# Patient Record
Sex: Female | Born: 1950 | Race: Black or African American | Hispanic: No | Smoking: Former smoker
Health system: Southern US, Community
[De-identification: ages and names within clinical notes are randomized; demographics above are authoritative.]

## PROBLEM LIST (undated history)

## (undated) DIAGNOSIS — E785 Hyperlipidemia, unspecified: Secondary | ICD-10-CM

## (undated) DIAGNOSIS — F419 Anxiety disorder, unspecified: Secondary | ICD-10-CM

## (undated) DIAGNOSIS — D696 Thrombocytopenia, unspecified: Secondary | ICD-10-CM

## (undated) DIAGNOSIS — I10 Essential (primary) hypertension: Secondary | ICD-10-CM

## (undated) DIAGNOSIS — G2581 Restless legs syndrome: Secondary | ICD-10-CM

## (undated) DIAGNOSIS — F329 Major depressive disorder, single episode, unspecified: Secondary | ICD-10-CM

## (undated) DIAGNOSIS — M1990 Unspecified osteoarthritis, unspecified site: Secondary | ICD-10-CM

## (undated) DIAGNOSIS — K219 Gastro-esophageal reflux disease without esophagitis: Secondary | ICD-10-CM

## (undated) DIAGNOSIS — F32A Depression, unspecified: Secondary | ICD-10-CM

## (undated) DIAGNOSIS — J40 Bronchitis, not specified as acute or chronic: Secondary | ICD-10-CM

## (undated) HISTORY — PX: OOPHORECTOMY: SHX86

## (undated) HISTORY — PX: HERNIA REPAIR: SHX51

## (undated) HISTORY — PX: ABDOMINAL HYSTERECTOMY: SHX81

---

## 2001-01-08 HISTORY — PX: BREAST EXCISIONAL BIOPSY: SUR124

## 2001-06-19 HISTORY — PX: BREAST CYST ASPIRATION: SHX578

## 2003-12-05 ENCOUNTER — Emergency Department: Payer: Self-pay | Admitting: General Practice

## 2003-12-05 ENCOUNTER — Other Ambulatory Visit: Payer: Self-pay

## 2003-12-17 ENCOUNTER — Ambulatory Visit: Payer: Self-pay

## 2005-05-25 ENCOUNTER — Ambulatory Visit: Payer: Self-pay

## 2005-07-05 ENCOUNTER — Other Ambulatory Visit: Payer: Self-pay

## 2005-07-05 ENCOUNTER — Emergency Department: Payer: Self-pay | Admitting: Emergency Medicine

## 2006-03-29 ENCOUNTER — Emergency Department: Payer: Self-pay | Admitting: Emergency Medicine

## 2006-03-29 ENCOUNTER — Other Ambulatory Visit: Payer: Self-pay

## 2006-06-11 ENCOUNTER — Emergency Department: Payer: Self-pay | Admitting: Emergency Medicine

## 2006-10-25 ENCOUNTER — Ambulatory Visit: Payer: Self-pay

## 2007-10-23 ENCOUNTER — Ambulatory Visit: Payer: Self-pay

## 2008-04-30 ENCOUNTER — Ambulatory Visit: Payer: Self-pay | Admitting: Gastroenterology

## 2008-06-18 ENCOUNTER — Ambulatory Visit: Payer: Self-pay | Admitting: Family Medicine

## 2009-09-09 ENCOUNTER — Ambulatory Visit: Payer: Self-pay

## 2010-09-15 ENCOUNTER — Ambulatory Visit: Payer: Self-pay

## 2011-11-02 ENCOUNTER — Emergency Department: Payer: Self-pay | Admitting: *Deleted

## 2011-11-02 LAB — COMPREHENSIVE METABOLIC PANEL
Alkaline Phosphatase: 97 U/L (ref 50–136)
Anion Gap: 7 (ref 7–16)
Bilirubin,Total: 0.4 mg/dL (ref 0.2–1.0)
Creatinine: 0.86 mg/dL (ref 0.60–1.30)
EGFR (African American): >60
Glucose: 88 mg/dL (ref 65–99)
SGOT(AST): 23 U/L (ref 15–37)
SGPT (ALT): 16 U/L (ref 12–78)
Sodium: 138 mmol/L (ref 136–145)
Total Protein: 7.9 g/dL (ref 6.4–8.2)

## 2011-11-02 LAB — CBC
HCT: 40.7 % (ref 35.0–47.0)
MCH: 30.1 pg (ref 26.0–34.0)
MCHC: 33.1 g/dL (ref 32.0–36.0)
MCV: 91 fL (ref 80–100)
Platelet: 197 10*3/uL (ref 150–440)
RDW: 15.3 % — ABNORMAL HIGH (ref 11.5–14.5)
WBC: 5.6 10*3/uL (ref 3.6–11.0)

## 2012-05-22 ENCOUNTER — Ambulatory Visit: Payer: Self-pay | Admitting: Family Medicine

## 2012-05-24 ENCOUNTER — Emergency Department: Payer: Self-pay | Admitting: Emergency Medicine

## 2013-03-21 ENCOUNTER — Ambulatory Visit: Payer: Self-pay | Admitting: Family Medicine

## 2013-10-23 ENCOUNTER — Ambulatory Visit: Payer: Self-pay | Admitting: Family Medicine

## 2014-08-15 ENCOUNTER — Other Ambulatory Visit: Payer: Self-pay | Admitting: Family Medicine

## 2014-08-15 DIAGNOSIS — N959 Unspecified menopausal and perimenopausal disorder: Secondary | ICD-10-CM

## 2014-11-17 ENCOUNTER — Other Ambulatory Visit: Payer: Self-pay | Admitting: Family Medicine

## 2014-11-17 DIAGNOSIS — Z1231 Encounter for screening mammogram for malignant neoplasm of breast: Secondary | ICD-10-CM

## 2014-11-25 ENCOUNTER — Ambulatory Visit
Admission: RE | Admit: 2014-11-25 | Discharge: 2014-11-25 | Disposition: A | Discharge status: Home / Self Care | Payer: Medicare Other | Source: Ambulatory Visit | Attending: Family Medicine | Admitting: Family Medicine

## 2014-11-25 ENCOUNTER — Other Ambulatory Visit: Payer: Self-pay | Admitting: Family Medicine

## 2014-11-25 DIAGNOSIS — M8580 Other specified disorders of bone density and structure, unspecified site: Secondary | ICD-10-CM | POA: Insufficient documentation

## 2014-11-25 DIAGNOSIS — Z1231 Encounter for screening mammogram for malignant neoplasm of breast: Secondary | ICD-10-CM

## 2014-11-25 DIAGNOSIS — N959 Unspecified menopausal and perimenopausal disorder: Secondary | ICD-10-CM

## 2014-11-25 DIAGNOSIS — Z13820 Encounter for screening for osteoporosis: Secondary | ICD-10-CM | POA: Insufficient documentation

## 2015-11-25 ENCOUNTER — Other Ambulatory Visit: Payer: Medicare Other

## 2015-11-26 ENCOUNTER — Encounter
Admission: RE | Admit: 2015-11-26 | Discharge: 2015-11-26 | Disposition: A | Discharge status: Home / Self Care | Payer: Medicare Other | Source: Ambulatory Visit | Attending: Surgery | Admitting: Surgery

## 2015-11-26 DIAGNOSIS — Z01812 Encounter for preprocedural laboratory examination: Secondary | ICD-10-CM | POA: Diagnosis not present

## 2015-11-26 DIAGNOSIS — K439 Ventral hernia without obstruction or gangrene: Secondary | ICD-10-CM | POA: Insufficient documentation

## 2015-11-26 HISTORY — DX: Essential (primary) hypertension: I10

## 2015-11-26 HISTORY — DX: Anxiety disorder, unspecified: F41.9

## 2015-11-26 HISTORY — DX: Depression, unspecified: F32.A

## 2015-11-26 HISTORY — DX: Restless legs syndrome: G25.81

## 2015-11-26 HISTORY — DX: Thrombocytopenia, unspecified: D69.6

## 2015-11-26 HISTORY — DX: Unspecified osteoarthritis, unspecified site: M19.90

## 2015-11-26 HISTORY — DX: Hyperlipidemia, unspecified: E78.5

## 2015-11-26 HISTORY — DX: Gastro-esophageal reflux disease without esophagitis: K21.9

## 2015-11-26 HISTORY — DX: Bronchitis, not specified as acute or chronic: J40

## 2015-11-26 HISTORY — DX: Major depressive disorder, single episode, unspecified: F32.9

## 2015-11-26 LAB — CBC
HCT: 42.2 % (ref 35.0–47.0)
Hemoglobin: 14.3 g/dL (ref 12.0–16.0)
MCH: 31.4 pg (ref 26.0–34.0)
MCHC: 34 g/dL (ref 32.0–36.0)
MCV: 92.3 fL (ref 80.0–100.0)
Platelets: 173 K/uL (ref 150–440)
RBC: 4.57 MIL/uL (ref 3.80–5.20)
RDW: 16 % — ABNORMAL HIGH (ref 11.5–14.5)
WBC: 5.6 K/uL (ref 3.6–11.0)

## 2015-11-26 LAB — DIFFERENTIAL
BASOS ABS: 0 10*3/uL (ref 0–0.1)
BASOS PCT: 0 %
EOS ABS: 0.1 10*3/uL (ref 0–0.7)
Eosinophils Relative: 1 %
LYMPHS ABS: 2.8 10*3/uL (ref 1.0–3.6)
Lymphocytes Relative: 49 %
Monocytes Absolute: 0.4 10*3/uL (ref 0.2–0.9)
Monocytes Relative: 8 %
NEUTROS ABS: 2.3 10*3/uL (ref 1.4–6.5)
NEUTROS PCT: 42 %

## 2015-11-26 NOTE — Patient Instructions (Signed)
  Your procedure is scheduled on: December 04, 2015 (Friday) Report to Same Day Surgery 2nd floor Medical Mall To find out your arrival time please call [phone] between 1PM - 3PM on  December 03, 2015 (Thursday)  Remember: Instructions that are not followed completely may result in serious medical risk, up to and including death, or upon the discretion of your surgeon and anesthesiologist your surgery may need to be rescheduled.    _x___ 1. Do not eat food or drink liquids after midnight. No gum chewing or hard candies.     _x    __ 2. No Alcohol for 24 hours before or after surgery.   __x__3. No Smoking for 24 prior to surgery.   ____  4. Bring all medications with you on the day of surgery if instructed.    __x__ 5. Notify your doctor if there is any change in your medical condition     (cold, fever, infections).     Do not wear jewelry, make-up, hairpins, clips or nail polish.  Do not wear lotions, powders, or perfumes. You may wear deodorant.  Do not shave 48 hours prior to surgery. Men may shave face and neck.  Do not bring valuables to the hospital.    [facility] any belongings or valuables.               Contacts, dentures or bridgework may not be worn into surgery.  Leave your suitcase in the car. After surgery it may be brought to your room.  For patients admitted to the hospital, discharge time is determined by your treatment team.   Patients discharged the day of surgery will not be allowed to drive home.    Please read over the following fact sheets that you were given:   [facility] Pre[facility] and or MRSA Information   _x___ Take these medicines the morning of surgery with A SIP OF WATER:    1. Lisinopril  2. Omeprazole (Omeprazole at bedtime on October 19)  3. Norvasc  4.  5.  6.  ____Fleets enema or Magnesium Citrate as directed.   _x___ Use CHG Soap or sage wipes as directed on instruction sheet   _x___ Use inhalers  on the day of surgery and bring to hospital day of surgery  (Use Albuterol inhaler the morning of surgery, and bring to hospital)  ____ Stop metformin 2 days prior to surgery    ____ Take 1/2 of usual insulin dose the night before surgery and none on the morning of           surgery.   _x___ Stop aspirin or coumadin, or plavix (Patient has stopped Aspirin)  x__ Stop Anti-inflammatories such as Advil, Aleve, Ibuprofen, Motrin, Naproxen,          Naprosyn, Goodies powders or aspirin products. Ok to take Tylenol.   ____ Stop supplements until after surgery.    ____ Bring C-Pap to the hospital.

## 2015-11-26 NOTE — Pre-Procedure Instructions (Signed)
EKG Ventricular Rate 60 BPM   EKG Atrial Rate 60 BPM   EKG P-R Interval 114 ms  EKG QRS Duration 86 ms  EKG Q-T Interval 430 ms  EKG QTC Calculation 430 ms  EKG Calculated P Axis 82 degrees   EKG Calculated R Axis 80 degrees   EKG Calculated T Axis -143 degrees   Result Narrative  NORMAL SINUS RHYTHM ST & T WAVE ABNORMALITY, CONSIDER LATERAL ISCHEMIA ABNORMAL ECG WHEN COMPARED WITH ECG OF 14-Oct-2015 14:12, NO SIGNIFICANT CHANGE WAS FOUND Confirmed by Julio AlmEEN, CODY ([id number]) on 10/20/2015 10:23:32 PM  Status Results Details    Hospital En[facility] on 10[facility]14/2015 [facility]")' href="[email or web address]/">Encounter Summary

## 2015-12-03 MED ORDER — CEFAZOLIN SODIUM-DEXTROSE 2-4 GM/100ML-% IV SOLN
2.0000 g | Freq: Once | INTRAVENOUS | Status: AC
  Administered 2015-12-04: 2 g via INTRAVENOUS

## 2015-12-04 ENCOUNTER — Encounter: Payer: Self-pay | Admitting: *Deleted

## 2015-12-04 ENCOUNTER — Encounter
Admission: RE | Disposition: A | Discharge status: Home / Self Care | Payer: Self-pay | Source: Ambulatory Visit | Attending: Surgery

## 2015-12-04 ENCOUNTER — Ambulatory Visit
Admission: RE | Admit: 2015-12-04 | Discharge: 2015-12-04 | Disposition: A | Discharge status: Home / Self Care | Payer: Medicare Other | Source: Ambulatory Visit | Attending: Surgery | Admitting: Surgery

## 2015-12-04 ENCOUNTER — Ambulatory Visit: Payer: Medicare Other | Admitting: Registered Nurse

## 2015-12-04 DIAGNOSIS — E785 Hyperlipidemia, unspecified: Secondary | ICD-10-CM | POA: Insufficient documentation

## 2015-12-04 DIAGNOSIS — K439 Ventral hernia without obstruction or gangrene: Secondary | ICD-10-CM | POA: Diagnosis present

## 2015-12-04 DIAGNOSIS — F17210 Nicotine dependence, cigarettes, uncomplicated: Secondary | ICD-10-CM | POA: Insufficient documentation

## 2015-12-04 DIAGNOSIS — Z832 Family history of diseases of the blood and blood-forming organs and certain disorders involving the immune mechanism: Secondary | ICD-10-CM | POA: Diagnosis not present

## 2015-12-04 DIAGNOSIS — Z881 Allergy status to other antibiotic agents status: Secondary | ICD-10-CM | POA: Insufficient documentation

## 2015-12-04 DIAGNOSIS — Z79899 Other long term (current) drug therapy: Secondary | ICD-10-CM | POA: Insufficient documentation

## 2015-12-04 DIAGNOSIS — F418 Other specified anxiety disorders: Secondary | ICD-10-CM | POA: Insufficient documentation

## 2015-12-04 DIAGNOSIS — Z888 Allergy status to other drugs, medicaments and biological substances status: Secondary | ICD-10-CM | POA: Insufficient documentation

## 2015-12-04 DIAGNOSIS — I10 Essential (primary) hypertension: Secondary | ICD-10-CM | POA: Diagnosis not present

## 2015-12-04 DIAGNOSIS — Z8489 Family history of other specified conditions: Secondary | ICD-10-CM | POA: Diagnosis not present

## 2015-12-04 DIAGNOSIS — K219 Gastro-esophageal reflux disease without esophagitis: Secondary | ICD-10-CM | POA: Insufficient documentation

## 2015-12-04 DIAGNOSIS — M1990 Unspecified osteoarthritis, unspecified site: Secondary | ICD-10-CM | POA: Diagnosis not present

## 2015-12-04 HISTORY — PX: VENTRAL HERNIA REPAIR: SHX424

## 2015-12-04 SURGERY — REPAIR, HERNIA, VENTRAL
Anesthesia: General | Wound class: Clean

## 2015-12-04 MED ORDER — PHENYLEPHRINE HCL 10 MG/ML IJ SOLN
INTRAMUSCULAR | Status: DC | PRN
  Administered 2015-12-04 (×7): 100 ug via INTRAVENOUS

## 2015-12-04 MED ORDER — BUPIVACAINE-EPINEPHRINE (PF) 0.5% -1:200000 IJ SOLN
INTRAMUSCULAR | Status: AC
  Filled 2015-12-04: qty 30

## 2015-12-04 MED ORDER — LIDOCAINE HCL (CARDIAC) 20 MG/ML IV SOLN
INTRAVENOUS | Status: DC | PRN
  Administered 2015-12-04: 60 mg via INTRAVENOUS

## 2015-12-04 MED ORDER — EPHEDRINE SULFATE 50 MG/ML IJ SOLN
INTRAMUSCULAR | Status: DC | PRN
  Administered 2015-12-04 (×4): 5 mg via INTRAVENOUS

## 2015-12-04 MED ORDER — ONDANSETRON HCL 4 MG/2ML IJ SOLN
INTRAMUSCULAR | Status: DC | PRN
  Administered 2015-12-04: 4 mg via INTRAVENOUS

## 2015-12-04 MED ORDER — ROCURONIUM BROMIDE 100 MG/10ML IV SOLN
INTRAVENOUS | Status: DC | PRN
  Administered 2015-12-04: 30 mg via INTRAVENOUS

## 2015-12-04 MED ORDER — SUGAMMADEX SODIUM 200 MG/2ML IV SOLN
INTRAVENOUS | Status: DC | PRN
  Administered 2015-12-04: 150 mg via INTRAVENOUS

## 2015-12-04 MED ORDER — HYDROCODONE-ACETAMINOPHEN 5-325 MG PO TABS: 1.0000 | ORAL_TABLET | ORAL | Status: DC | PRN

## 2015-12-04 MED ORDER — DEXAMETHASONE SODIUM PHOSPHATE 10 MG/ML IJ SOLN
INTRAMUSCULAR | Status: DC | PRN
  Administered 2015-12-04: 4 mg via INTRAVENOUS

## 2015-12-04 MED ORDER — FENTANYL CITRATE (PF) 100 MCG/2ML IJ SOLN
25.0000 ug | INTRAMUSCULAR | Status: DC | PRN
  Administered 2015-12-04 (×4): 25 ug via INTRAVENOUS

## 2015-12-04 MED ORDER — ONDANSETRON HCL 4 MG/2ML IJ SOLN: 4.0000 mg | Freq: Once | INTRAMUSCULAR | Status: DC | PRN

## 2015-12-04 MED ORDER — PROPOFOL 10 MG/ML IV BOLUS
INTRAVENOUS | Status: DC | PRN
  Administered 2015-12-04: 20 mg via INTRAVENOUS
  Administered 2015-12-04: 100 mg via INTRAVENOUS

## 2015-12-04 MED ORDER — HYDROCODONE-ACETAMINOPHEN 5-325 MG PO TABS: 1.0000 | ORAL_TABLET | ORAL | 0 refills | Status: DC | PRN

## 2015-12-04 MED ORDER — FENTANYL CITRATE (PF) 100 MCG/2ML IJ SOLN
INTRAMUSCULAR | Status: AC
  Filled 2015-12-04: qty 2

## 2015-12-04 MED ORDER — LACTATED RINGERS IV SOLN
INTRAVENOUS | Status: DC
  Administered 2015-12-04: 09:00:00 via INTRAVENOUS

## 2015-12-04 MED ORDER — BUPIVACAINE-EPINEPHRINE 0.5% -1:200000 IJ SOLN
INTRAMUSCULAR | Status: DC | PRN
  Administered 2015-12-04: 10 mL

## 2015-12-04 MED ORDER — CEFAZOLIN SODIUM-DEXTROSE 2-4 GM/100ML-% IV SOLN
INTRAVENOUS | Status: AC
  Filled 2015-12-04: qty 100

## 2015-12-04 MED ORDER — MIDAZOLAM HCL 2 MG/2ML IJ SOLN
INTRAMUSCULAR | Status: DC | PRN
  Administered 2015-12-04: 2 mg via INTRAVENOUS

## 2015-12-04 MED ORDER — FENTANYL CITRATE (PF) 100 MCG/2ML IJ SOLN
INTRAMUSCULAR | Status: DC | PRN
  Administered 2015-12-04 (×3): 50 ug via INTRAVENOUS

## 2015-12-04 SURGICAL SUPPLY — 23 items
CANISTER SUCT 1200ML W/VALVE (MISCELLANEOUS) ×3 IMPLANT
CHLORAPREP W/TINT 26ML (MISCELLANEOUS) ×3 IMPLANT
DRAPE LAPAROTOMY 100X77 ABD (DRAPES) ×3 IMPLANT
ELECT REM PT RETURN 9FT ADLT (ELECTROSURGICAL) ×3
ELECTRODE REM PT RTRN 9FT ADLT (ELECTROSURGICAL) ×1 IMPLANT
GAUZE SPONGE 4X4 12PLY STRL (GAUZE/BANDAGES/DRESSINGS) IMPLANT
GLOVE BIO SURGEON STRL SZ7.5 (GLOVE) ×15 IMPLANT
GOWN STRL REUS W/ TWL LRG LVL3 (GOWN DISPOSABLE) ×3 IMPLANT
GOWN STRL REUS W/TWL LRG LVL3 (GOWN DISPOSABLE) ×6
KIT RM TURNOVER STRD PROC AR (KITS) ×3 IMPLANT
LABEL OR SOLS (LABEL) ×3 IMPLANT
LIQUID BAND (GAUZE/BANDAGES/DRESSINGS) ×3 IMPLANT
MESH BARD SOFT 6X6IN (Mesh General) ×3 IMPLANT
NEEDLE HYPO 25X1 1.5 SAFETY (NEEDLE) ×3 IMPLANT
NS IRRIG 500ML POUR BTL (IV SOLUTION) ×3 IMPLANT
PACK BASIN MINOR ARMC (MISCELLANEOUS) ×3 IMPLANT
STAPLER SKIN PROX 35W (STAPLE) IMPLANT
SUT CHROMIC 3 0 SH 27 (SUTURE) ×3 IMPLANT
SUT MNCRL 4-0 (SUTURE) ×2
SUT MNCRL 4-0 27XMFL (SUTURE) ×1
SUT SURGILON 0 30 BLK (SUTURE) ×9 IMPLANT
SUTURE MNCRL 4-0 27XMF (SUTURE) ×1 IMPLANT
SYRINGE 10CC LL (SYRINGE) ×3 IMPLANT

## 2015-12-04 NOTE — Anesthesia Preprocedure Evaluation (Signed)
Anesthesia Evaluation  Patient identified by MRN, date of birth, ID band Patient awake    Reviewed: Allergy & Precautions, NPO status , Patient's Chart, lab work & pertinent test results  Airway Mallampati: II  TM Distance: <3 FB     Dental  (+) Upper Dentures   Pulmonary Current Smoker,    Pulmonary exam normal        Cardiovascular hypertension, Pt. on medications Normal cardiovascular exam     Neuro/Psych PSYCHIATRIC DISORDERS Anxiety Depression negative neurological ROS     GI/Hepatic Neg liver ROS, GERD  Medicated,  Endo/Other  negative endocrine ROS  Renal/GU negative Renal ROS  negative genitourinary   Musculoskeletal  (+) Arthritis , Osteoarthritis,    Abdominal Normal abdominal exam  (+)   Peds negative pediatric ROS (+)  Hematology negative hematology ROS (+)   Anesthesia Other Findings   Reproductive/Obstetrics                             Anesthesia Physical Anesthesia Plan  ASA: II  Anesthesia Plan: General   Post-op Pain Management:    Induction: Intravenous  Airway Management Planned: Oral ETT  Additional Equipment:   Intra-op Plan:   Post-operative Plan: Extubation in OR  Informed Consent: I have reviewed the patients History and Physical, chart, labs and discussed the procedure including the risks, benefits and alternatives for the proposed anesthesia with the patient or authorized representative who has indicated his/her understanding and acceptance.   Dental advisory given  Plan Discussed with: CRNA and Surgeon  Anesthesia Plan Comments:         Anesthesia Quick Evaluation

## 2015-12-04 NOTE — Transfer of Care (Signed)
Immediate Anesthesia Transfer of Care Note  Patient: Heather Phelps  Procedure(s) Performed: Procedure(s): HERNIA REPAIR VENTRAL ADULT (N/A)  Patient Location: PACU  Anesthesia Type:General  Level of Consciousness: awake  Airway & Oxygen Therapy: Patient Spontanous Breathing  Post-op Assessment: Report given to RN  Post vital signs: stable  Last Vitals:  Vitals:   12/04/15 0849 12/04/15 1105  BP: (!) 172/69 (!) 157/76  Pulse: 69 76  Resp: 16 10  Temp: 36.5 C 37.1 C    Last Pain:  Vitals:   12/04/15 0849  TempSrc: Oral         Complications: No apparent anesthesia complications

## 2015-12-04 NOTE — Anesthesia Procedure Notes (Signed)
Procedure Name: Intubation Date/Time: 12/04/2015 9:57 AM Performed by: Karoline CaldwellSTARR, Brison Fiumara Pre-anesthesia Checklist: Patient identified, Emergency Drugs available, Suction available, Patient being monitored and Timeout performed Patient Re-evaluated:Patient Re-evaluated prior to inductionOxygen Delivery Method: Circle system utilized Preoxygenation: Pre-oxygenation with 100% oxygen Intubation Type: IV induction Ventilation: Mask ventilation without difficulty Laryngoscope Size: Mac and 3 Grade View: Grade I Tube type: Oral Tube size: 7.0 mm Number of attempts: 1 Airway Equipment and Method: Stylet Placement Confirmation: ETT inserted through vocal cords under direct vision,  positive ETCO2,  CO2 detector and breath sounds checked- equal and bilateral Secured at: 21 cm Tube secured with: Tape Dental Injury: Teeth and Oropharynx as per pre-operative assessment

## 2015-12-04 NOTE — H&P (Signed)
  She comes in today prepared for ventral hernia repair  She reports no change in overall condition since the day of the office exam.  Lab work is reviewed.  On physical exam there is a tender smooth small mass in the epigastrium approximately 1 inch cephalad to the umbilicus.  I discussed the plan for ventral hernia repair

## 2015-12-04 NOTE — Discharge Instructions (Addendum)
Take Tylenol or Norco if needed for pain.  Should not drive or do anything dangerous been taking Norco.  The resume aspirin on Saturday.  May shower.  Avoid straining and heavy lifting.    AMBULATORY SURGERY  DISCHARGE INSTRUCTIONS   1) The drugs that you were given will stay in your system until tomorrow so for the next 24 hours you should not:  A) Drive an automobile B) Make any legal decisions C) Drink any alcoholic beverage   2) You may resume regular meals tomorrow.  Today it is better to start with liquids and gradually work up to solid foods.  You may eat anything you prefer, but it is better to start with liquids, then soup and crackers, and gradually work up to solid foods.   3) Please notify your doctor immediately if you have any unusual bleeding, trouble breathing, redness and pain at the surgery site, drainage, fever, or pain not relieved by medication.    4) Additional Instructions:        Please contact your physician with any problems or Same Day Surgery at [phone], Monday through Friday 6 am to 4 pm, or [place] [facility] Main number at [phone].

## 2015-12-04 NOTE — Op Note (Signed)
OPERATIVE REPORT  PREOPERATIVE  DIAGNOSIS: . Ventral hernia  POSTOPERATIVE DIAGNOSIS: . Ventral hernia  PROCEDURE: . Ventral hernia repair  ANESTHESIA:  General  SURGEON: Renda RollsWilton Smith  MD   INDICATIONS: . She reports a history of pain in the supraumbilical area of the epigastrium. She had findings of a ventral hernia which was tender. Repair is recommended for definitive treatment.  With the patient on the operating table in the supine position she was placed under general anesthesia. The abdomen was prepared with ChloraPrep and draped in a sterile manner. A longitudinally oriented incision was made in the epigastrium just above the navel approximately 3 cm in length and carried down through subcutaneous tissues. There was herniated properitoneal fat which was dissected free from surrounding structures down to the fascial ring defect. The fascial ring defect was approximately 6 mm. The herniated properitoneal fat was incarcerated. It was necessary to enlarge the defect on each side. With additional manipulation the properitoneal fat was reduced back into the abdominal cavity. Bard soft mesh was cut to create a circular shape of some 1.8 cm in diameter. This was placed into the properitoneal plane and sutured to the overlying fascia with through and through 0 Surgilon sutures. The repair was carried out with a transversely oriented suture line of interrupted 0 Surgilon figure-of-eight sutures incorporating each suture into the mesh. The deep fascia and subcutaneous tissues were infiltrated with half percent Sensorcaine with epinephrine. The subcutaneous tissues were closed with a 4-0 Monocryl pursestring suture. The skin was closed with running 4-0 Monocryl subcuticular suture and Dermabond.  The patient tolerated surgery satisfactorily and was then prepared for transfer to the recovery room  [facility] Smith M.D.

## 2015-12-04 NOTE — Anesthesia Postprocedure Evaluation (Signed)
Anesthesia Post Note  Patient: Heather Phelps  Procedure(s) Performed: Procedure(s) (LRB): HERNIA REPAIR VENTRAL ADULT (N/A)  Patient location during evaluation: PACU Anesthesia Type: General Level of consciousness: awake and alert and oriented Pain management: pain level controlled Vital Signs Assessment: post-procedure vital signs reviewed and stable Respiratory status: spontaneous breathing Cardiovascular status: blood pressure returned to baseline Anesthetic complications: no    Last Vitals:  Vitals:   12/04/15 1200 12/04/15 1230  BP: 140/66 140/68  Pulse: 66 68  Resp: 16 16  Temp: 36.6 C     Last Pain:  Vitals:   12/04/15 1230  TempSrc:   PainSc: 4                  Esbeydi Manago

## 2015-12-11 ENCOUNTER — Encounter: Payer: Self-pay | Admitting: Surgery

## 2015-12-14 ENCOUNTER — Encounter: Payer: Self-pay | Admitting: Surgery

## 2015-12-17 ENCOUNTER — Other Ambulatory Visit: Payer: Self-pay | Admitting: Family Medicine

## 2015-12-17 DIAGNOSIS — Z1231 Encounter for screening mammogram for malignant neoplasm of breast: Secondary | ICD-10-CM

## 2016-01-22 ENCOUNTER — Ambulatory Visit
Admission: RE | Admit: 2016-01-22 | Discharge: 2016-01-22 | Disposition: A | Discharge status: Home / Self Care | Payer: Medicare Other | Source: Ambulatory Visit | Attending: Family Medicine | Admitting: Family Medicine

## 2016-01-22 DIAGNOSIS — Z1231 Encounter for screening mammogram for malignant neoplasm of breast: Secondary | ICD-10-CM | POA: Insufficient documentation

## 2016-03-29 NOTE — Pre-Procedure Instructions (Signed)
ECG 12 Lead1/15/2018 [facility] Component Name Value Ref Range  EKG Ventricular Rate 69 BPM   EKG Atrial Rate 69 BPM   EKG P-R Interval 118 ms  EKG QRS Duration 84 ms  EKG Q-T Interval 402 ms  EKG QTC Calculation 430 ms  EKG Calculated P Axis 37 degrees   EKG Calculated R Axis 73 degrees   EKG Calculated T Axis 76 degrees   Result Narrative  NORMAL SINUS RHYTHM NORMAL ECG WHEN COMPARED WITH ECG OF 15-Oct-2015 07:18, T WAVE INVERSION NO LONGER EVIDENT IN LATERAL LEADS Confirmed by SMITHMD, SIDNEY ([id number]) on 03/01/2016 7:37:07 PM  Status Results Details    Hospital Encounter on 02/29/2016 [facility]")' href="[email or web address]/">Encounter Summary

## 2016-03-29 NOTE — Patient Instructions (Signed)
  Your procedure is scheduled on: 04-06-16 [facility](WEDNESDAY) Report to Same Day Surgery 2nd floor medical mall [facility](Medical Mall Entrance-take elevator on left to 2nd floor.  Check in with surgery information desk.) To find out your arrival time please call [phone] between 1PM - 3PM on 04-05-16 (TUESDAY)  Remember: Instructions that are not followed completely may result in serious medical risk, up to and including death, or upon the discretion of your surgeon and anesthesiologist your surgery may need to be rescheduled.    _x___ 1. Do not eat food or drink liquids after midnight. No gum chewing or hard candies.     __x__ 2. No Alcohol for 24 hours before or after surgery.   __x__3. No Smoking for 24 prior to surgery.   ____  4. Bring all medications with you on the day of surgery if instructed.    __x__ 5. Notify your doctor if there is any change in your medical condition     (cold, fever, infections).     Do not wear jewelry, make-up, hairpins, clips or nail polish.  Do not wear lotions, powders, or perfumes. You may wear deodorant.  Do not shave 48 hours prior to surgery. Men may shave face and neck.  Do not bring valuables to the hospital.    [facility] is not responsible for any belongings or valuables.               Contacts, dentures or bridgework may not be worn into surgery.  Leave your suitcase in the car. After surgery it may be brought to your room.  For patients admitted to the hospital, discharge time is determined by your treatment team.   Patients discharged the day of surgery will not be allowed to drive home.  You will need someone to drive you home and stay with you the night of your procedure.    Please read over the following fact sheets that you were given:   [facility] Pre[facility] and or MRSA Information   _x___ Take these medicines the morning of surgery with A SIP OF WATER:    1. AMLODIPINE (NORVASC)  2. LISINOPRIL  3. MAGNESIUM  4. OMEPRAZOLE  (PRILOSEC)  5. ALSO TAKE AN EXTRA OMEPRAZOLE ON Tuesday NIGHT BEFORE BED  6.  ____Fleets enema or Magnesium Citrate as directed.   _x___ Use CHG Soap or sage wipes as directed on instruction sheet   _X___ Use inhalers on the day of surgery and bring to hospital day of surgery-USE ALBUTEROL INHALER AT HOME AND BRING TO HOSPITAL  ____ Stop metformin 2 days prior to surgery    ____ Take 1/2 of usual insulin dose the night before surgery and none on the morning of  surgery.   _X___ Stop Aspirin, Coumadin, Pllavix ,Eliquis, Effient, or Pradaxa-PT ALREADY STOPPED ASPIRIN  x__ Stop Anti-inflammatories such as Advil, Aleve, Ibuprofen, Motrin, Naproxen,          Naprosyn, Goodies powders or aspirin products NOW-Ok to take Tylenol.   ____ Stop supplements until after surgery.    ____ Bring C-Pap to the hospital.

## 2016-03-31 ENCOUNTER — Inpatient Hospital Stay: Admission: RE | Admit: 2016-03-31 | Payer: PRIVATE HEALTH INSURANCE | Source: Ambulatory Visit

## 2016-04-01 ENCOUNTER — Encounter
Admission: RE | Admit: 2016-04-01 | Discharge: 2016-04-01 | Disposition: A | Discharge status: Home / Self Care | Payer: Medicare Other | Source: Ambulatory Visit | Attending: Orthopedic Surgery | Admitting: Orthopedic Surgery

## 2016-04-01 DIAGNOSIS — Z01812 Encounter for preprocedural laboratory examination: Secondary | ICD-10-CM | POA: Diagnosis not present

## 2016-04-01 DIAGNOSIS — I10 Essential (primary) hypertension: Secondary | ICD-10-CM | POA: Insufficient documentation

## 2016-04-01 DIAGNOSIS — M654 Radial styloid tenosynovitis [de Quervain]: Secondary | ICD-10-CM | POA: Insufficient documentation

## 2016-04-01 DIAGNOSIS — Z79899 Other long term (current) drug therapy: Secondary | ICD-10-CM | POA: Diagnosis not present

## 2016-04-01 DIAGNOSIS — Z832 Family history of diseases of the blood and blood-forming organs and certain disorders involving the immune mechanism: Secondary | ICD-10-CM | POA: Diagnosis not present

## 2016-04-01 DIAGNOSIS — E785 Hyperlipidemia, unspecified: Secondary | ICD-10-CM | POA: Insufficient documentation

## 2016-04-01 DIAGNOSIS — K219 Gastro-esophageal reflux disease without esophagitis: Secondary | ICD-10-CM | POA: Diagnosis not present

## 2016-04-01 DIAGNOSIS — Z888 Allergy status to other drugs, medicaments and biological substances status: Secondary | ICD-10-CM | POA: Diagnosis not present

## 2016-04-01 DIAGNOSIS — Z7982 Long term (current) use of aspirin: Secondary | ICD-10-CM | POA: Insufficient documentation

## 2016-04-01 DIAGNOSIS — Z9889 Other specified postprocedural states: Secondary | ICD-10-CM | POA: Diagnosis not present

## 2016-04-01 LAB — CBC
HCT: 40.8 % (ref 35.0–47.0)
HEMOGLOBIN: 13.4 g/dL (ref 12.0–16.0)
MCH: 30.5 pg (ref 26.0–34.0)
MCHC: 32.9 g/dL (ref 32.0–36.0)
MCV: 92.5 fL (ref 80.0–100.0)
Platelets: 169 10*3/uL (ref 150–440)
RBC: 4.41 MIL/uL (ref 3.80–5.20)
RDW: 16 % — ABNORMAL HIGH (ref 11.5–14.5)
WBC: 5.5 10*3/uL (ref 3.6–11.0)

## 2016-04-01 LAB — SURGICAL PCR SCREEN
MRSA, PCR: NEGATIVE
Staphylococcus aureus: NEGATIVE

## 2016-04-06 ENCOUNTER — Ambulatory Visit: Payer: Medicare Other | Admitting: Anesthesiology

## 2016-04-06 ENCOUNTER — Encounter
Admission: RE | Disposition: A | Discharge status: Home / Self Care | Payer: Self-pay | Source: Ambulatory Visit | Attending: Orthopedic Surgery

## 2016-04-06 ENCOUNTER — Ambulatory Visit
Admission: RE | Admit: 2016-04-06 | Discharge: 2016-04-06 | Disposition: A | Discharge status: Home / Self Care | Payer: Medicare Other | Source: Ambulatory Visit | Attending: Orthopedic Surgery | Admitting: Orthopedic Surgery

## 2016-04-06 ENCOUNTER — Encounter: Payer: Self-pay | Admitting: *Deleted

## 2016-04-06 DIAGNOSIS — Z7982 Long term (current) use of aspirin: Secondary | ICD-10-CM | POA: Insufficient documentation

## 2016-04-06 DIAGNOSIS — E785 Hyperlipidemia, unspecified: Secondary | ICD-10-CM | POA: Diagnosis not present

## 2016-04-06 DIAGNOSIS — Z791 Long term (current) use of non-steroidal anti-inflammatories (NSAID): Secondary | ICD-10-CM | POA: Diagnosis not present

## 2016-04-06 DIAGNOSIS — I10 Essential (primary) hypertension: Secondary | ICD-10-CM | POA: Diagnosis not present

## 2016-04-06 DIAGNOSIS — Z79899 Other long term (current) drug therapy: Secondary | ICD-10-CM | POA: Diagnosis not present

## 2016-04-06 DIAGNOSIS — K219 Gastro-esophageal reflux disease without esophagitis: Secondary | ICD-10-CM | POA: Diagnosis not present

## 2016-04-06 DIAGNOSIS — F418 Other specified anxiety disorders: Secondary | ICD-10-CM | POA: Diagnosis not present

## 2016-04-06 DIAGNOSIS — F17200 Nicotine dependence, unspecified, uncomplicated: Secondary | ICD-10-CM | POA: Insufficient documentation

## 2016-04-06 DIAGNOSIS — Z888 Allergy status to other drugs, medicaments and biological substances status: Secondary | ICD-10-CM | POA: Insufficient documentation

## 2016-04-06 DIAGNOSIS — Z881 Allergy status to other antibiotic agents status: Secondary | ICD-10-CM | POA: Diagnosis not present

## 2016-04-06 DIAGNOSIS — M654 Radial styloid tenosynovitis [de Quervain]: Secondary | ICD-10-CM | POA: Diagnosis not present

## 2016-04-06 HISTORY — PX: DORSAL COMPARTMENT RELEASE: SHX5039

## 2016-04-06 SURGERY — RELEASE, FIRST DORSAL COMPARTMENT, HAND
Anesthesia: General | Site: Arm Lower | Laterality: Left | Wound class: Clean

## 2016-04-06 MED ORDER — FENTANYL CITRATE (PF) 100 MCG/2ML IJ SOLN
INTRAMUSCULAR | Status: AC
  Administered 2016-04-06: 25 ug via INTRAVENOUS
  Filled 2016-04-06: qty 2

## 2016-04-06 MED ORDER — SUCCINYLCHOLINE CHLORIDE 20 MG/ML IJ SOLN
INTRAMUSCULAR | Status: DC | PRN
  Administered 2016-04-06: 100 mg via INTRAVENOUS

## 2016-04-06 MED ORDER — SODIUM CHLORIDE 0.9 % IV SOLN: INTRAVENOUS | Status: DC

## 2016-04-06 MED ORDER — ONDANSETRON HCL 4 MG PO TABS: 4.0000 mg | ORAL_TABLET | Freq: Four times a day (QID) | ORAL | Status: DC | PRN

## 2016-04-06 MED ORDER — CHLORHEXIDINE GLUCONATE 4 % EX LIQD: 60.0000 mL | Freq: Once | CUTANEOUS | Status: DC

## 2016-04-06 MED ORDER — FENTANYL CITRATE (PF) 100 MCG/2ML IJ SOLN
INTRAMUSCULAR | Status: DC | PRN
  Administered 2016-04-06 (×2): 50 ug via INTRAVENOUS

## 2016-04-06 MED ORDER — FENTANYL CITRATE (PF) 100 MCG/2ML IJ SOLN
INTRAMUSCULAR | Status: AC
  Filled 2016-04-06: qty 2

## 2016-04-06 MED ORDER — METOCLOPRAMIDE HCL 5 MG/ML IJ SOLN: 5.0000 mg | Freq: Three times a day (TID) | INTRAMUSCULAR | Status: DC | PRN

## 2016-04-06 MED ORDER — SUGAMMADEX SODIUM 200 MG/2ML IV SOLN
INTRAVENOUS | Status: AC
  Filled 2016-04-06: qty 2

## 2016-04-06 MED ORDER — ONDANSETRON HCL 4 MG/2ML IJ SOLN
INTRAMUSCULAR | Status: DC | PRN
  Administered 2016-04-06: 4 mg via INTRAVENOUS

## 2016-04-06 MED ORDER — METOCLOPRAMIDE HCL 10 MG PO TABS: 5.0000 mg | ORAL_TABLET | Freq: Three times a day (TID) | ORAL | Status: DC | PRN

## 2016-04-06 MED ORDER — ONDANSETRON HCL 4 MG/2ML IJ SOLN: 4.0000 mg | Freq: Four times a day (QID) | INTRAMUSCULAR | Status: DC | PRN

## 2016-04-06 MED ORDER — SUGAMMADEX SODIUM 200 MG/2ML IV SOLN
INTRAVENOUS | Status: DC | PRN
  Administered 2016-04-06: 133.4 mg via INTRAVENOUS

## 2016-04-06 MED ORDER — PROPOFOL 10 MG/ML IV BOLUS
INTRAVENOUS | Status: AC
  Filled 2016-04-06: qty 20

## 2016-04-06 MED ORDER — HYDROCODONE-ACETAMINOPHEN 5-325 MG PO TABS: 1.0000 | ORAL_TABLET | ORAL | Status: DC | PRN

## 2016-04-06 MED ORDER — HYDROCODONE-ACETAMINOPHEN 5-325 MG PO TABS: 1.0000 | ORAL_TABLET | ORAL | 0 refills | Status: DC | PRN

## 2016-04-06 MED ORDER — MIDAZOLAM HCL 2 MG/2ML IJ SOLN
INTRAMUSCULAR | Status: AC
  Filled 2016-04-06: qty 2

## 2016-04-06 MED ORDER — DEXAMETHASONE SODIUM PHOSPHATE 10 MG/ML IJ SOLN
INTRAMUSCULAR | Status: DC | PRN
  Administered 2016-04-06: 10 mg via INTRAVENOUS

## 2016-04-06 MED ORDER — ROCURONIUM BROMIDE 100 MG/10ML IV SOLN
INTRAVENOUS | Status: DC | PRN
  Administered 2016-04-06: 10 mg via INTRAVENOUS

## 2016-04-06 MED ORDER — SUCCINYLCHOLINE CHLORIDE 20 MG/ML IJ SOLN
INTRAMUSCULAR | Status: AC
  Filled 2016-04-06: qty 1

## 2016-04-06 MED ORDER — BUPIVACAINE HCL (PF) 0.25 % IJ SOLN
INTRAMUSCULAR | Status: DC | PRN
  Administered 2016-04-06: 2 mL

## 2016-04-06 MED ORDER — ONDANSETRON HCL 4 MG/2ML IJ SOLN: 4.0000 mg | Freq: Once | INTRAMUSCULAR | Status: DC | PRN

## 2016-04-06 MED ORDER — LACTATED RINGERS IV SOLN
INTRAVENOUS | Status: DC
  Administered 2016-04-06 (×2): via INTRAVENOUS

## 2016-04-06 MED ORDER — CEFAZOLIN SODIUM-DEXTROSE 2-4 GM/100ML-% IV SOLN
2.0000 g | INTRAVENOUS | Status: AC
  Administered 2016-04-06: 2 g via INTRAVENOUS

## 2016-04-06 MED ORDER — ROCURONIUM BROMIDE 50 MG/5ML IV SOSY
PREFILLED_SYRINGE | INTRAVENOUS | Status: AC
  Filled 2016-04-06: qty 5

## 2016-04-06 MED ORDER — PROPOFOL 10 MG/ML IV BOLUS
INTRAVENOUS | Status: DC | PRN
  Administered 2016-04-06: 20 mg via INTRAVENOUS
  Administered 2016-04-06: 150 mg via INTRAVENOUS

## 2016-04-06 MED ORDER — FENTANYL CITRATE (PF) 100 MCG/2ML IJ SOLN
25.0000 ug | INTRAMUSCULAR | Status: DC | PRN
  Administered 2016-04-06 (×4): 25 ug via INTRAVENOUS

## 2016-04-06 SURGICAL SUPPLY — 27 items
BANDAGE ELASTIC 2 LF NS (GAUZE/BANDAGES/DRESSINGS) ×3 IMPLANT
BLADE SURG 15 STRL LF DISP TIS (BLADE) ×1 IMPLANT
BLADE SURG 15 STRL SS (BLADE) ×2
BNDG ESMARK 4X12 TAN STRL LF (GAUZE/BANDAGES/DRESSINGS) ×3 IMPLANT
CANISTER SUCT 1200ML W/VALVE (MISCELLANEOUS) ×3 IMPLANT
CUFF TOURN 18 STER (MISCELLANEOUS) ×3 IMPLANT
DURAPREP 26ML APPLICATOR (WOUND CARE) ×3 IMPLANT
ELECT CAUTERY NEEDLE TIP 1.0 (MISCELLANEOUS) ×3
ELECT REM PT RETURN 9FT ADLT (ELECTROSURGICAL) ×3
ELECTRODE CAUTERY NEDL TIP 1.0 (MISCELLANEOUS) ×1 IMPLANT
ELECTRODE REM PT RTRN 9FT ADLT (ELECTROSURGICAL) ×1 IMPLANT
GAUZE SPONGE 4X4 12PLY STRL (GAUZE/BANDAGES/DRESSINGS) ×3 IMPLANT
GAUZE STRETCH 2X75IN STRL (MISCELLANEOUS) ×3 IMPLANT
GLOVE BIO SURGEON STRL SZ8 (GLOVE) ×3 IMPLANT
GLOVE BIOGEL M STRL SZ7.5 (GLOVE) ×3 IMPLANT
GOWN STRL REUS W/ TWL LRG LVL3 (GOWN DISPOSABLE) ×2 IMPLANT
GOWN STRL REUS W/TWL LRG LVL3 (GOWN DISPOSABLE) ×4
KIT RM TURNOVER STRD PROC AR (KITS) ×3 IMPLANT
NEEDLE HYPO 25X1 1.5 SAFETY (NEEDLE) ×3 IMPLANT
NS IRRIG 500ML POUR BTL (IV SOLUTION) ×3 IMPLANT
PACK EXTREMITY ARMC (MISCELLANEOUS) ×3 IMPLANT
PAD CAST CTTN 4X4 STRL (SOFTGOODS) ×1 IMPLANT
PADDING CAST COTTON 4X4 STRL (SOFTGOODS) ×2
SPLINT CAST 1 STEP 3X12 (MISCELLANEOUS) ×3 IMPLANT
STOCKINETTE STRL 4IN 9604848 (GAUZE/BANDAGES/DRESSINGS) ×3 IMPLANT
SUT ETHILON 5-0 FS-2 18 BLK (SUTURE) ×3 IMPLANT
SYRINGE 10CC LL (SYRINGE) ×3 IMPLANT

## 2016-04-06 NOTE — Op Note (Signed)
OPERATIVE NOTE  DATE OF SURGERY:  04/06/2016  PATIENT NAME:  Servando SnareBrenda Y Biddinger   DOB: 07/17/1950  MRN: [id number]  PRE-OPERATIVE DIAGNOSIS: Left DeQuervain's stenosing tenosynovitis  POST-OPERATIVE DIAGNOSIS:  Same  PROCEDURE:  Left DeQuervain's release  SURGEON:  Jena GaussJames P Hooten, Jr. M.D.  ANESTHESIA: general  ESTIMATED BLOOD LOSS: 0 mL  FLUIDS REPLACED: 5001 mL of crystalloid  TOURNIQUET TIME: 9 minutes  DRAINS: None  INDICATIONS FOR SURGERY: Servando SnareBrenda Y Beaubien is a 66 y.o. year old female with a  history of pain over the first dorsal compartment of the left wrist. The patient had not seen any significant improvement despite conservative nonsurgical intervention. After discussion of the risks and benefits of surgical intervention, the patient expressed understanding of the risks benefits and agree with plans for release of the first dorsal compartment of the wrist (DeQuervain's release).   PROCEDURE IN DETAIL: The patient was brought into the operating room and after adequate general anesthesia, a tourniquet was placed on the patient's left upper arm.The left hand and arm were prepped with alcohol and Duraprep and draped in the usual sterile fashion. A "time-out" was performed as per usual protocol. The hand and forearm were exsanguinated using an Esmarch and the tourniquet was inflated to 250 mmHg. Loupe magnification was used throughout the procedure. An incision was made over the first dorsal compartment approximately 0.5 centimeters proximal to the tip of the radial styloid. Care was taken to identify and protect the radial sensory branches. Dissection was carried down to the annular ligament. The annular ligament was sharply incised and there was noted to be some thickening and inflammatory changes to the tenosynovium which was subsequently debrided. The extensor pollicis brevis and abductor pollicis longus were elevated out of the wound with a hook to document complete decompression.  Free and independent movement of the tendons was noticed by passively moving the thumb. The tourniquet was deflated after total tourniquet time of 19 minutes. Hemostasis was achieved using electrocautery. The skin was then re-approximated with interrupted sutures of #5-0 nylon. A sterile compression dressing was applied   The patient tolerated the procedure well and was transported to the PACU in stable condition.  James P. Angie FavaHooten, Jr., M.D.

## 2016-04-06 NOTE — Anesthesia Preprocedure Evaluation (Signed)
Anesthesia Evaluation  Patient identified by MRN, date of birth, ID band Patient awake    Reviewed: Allergy & Precautions, NPO status , Patient's Chart, lab work & pertinent test results  Airway Mallampati: II  TM Distance: <3 FB     Dental  (+) Upper Dentures   Pulmonary Current Smoker,    Pulmonary exam normal        Cardiovascular hypertension, Pt. on medications Normal cardiovascular exam     Neuro/Psych PSYCHIATRIC DISORDERS Anxiety Depression negative neurological ROS     GI/Hepatic Neg liver ROS, GERD  Medicated,  Endo/Other  negative endocrine ROS  Renal/GU negative Renal ROS  negative genitourinary   Musculoskeletal  (+) Arthritis , Osteoarthritis,    Abdominal Normal abdominal exam  (+)   Peds negative pediatric ROS (+)  Hematology negative hematology ROS (+)   Anesthesia Other Findings Past Medical History: No date: Anxiety No date: Arthritis No date: Bronchitis No date: Depression No date: GERD (gastroesophageal reflux disease) No date: Hyperlipidemia No date: Hypertension No date: Restless leg syndrome No date: Thrombocytopenia (HCC)  Reproductive/Obstetrics                             Anesthesia Physical  Anesthesia Plan  ASA: II  Anesthesia Plan: General   Post-op Pain Management:    Induction: Intravenous  Airway Management Planned: Oral ETT  Additional Equipment:   Intra-op Plan:   Post-operative Plan: Extubation in OR  Informed Consent: I have reviewed the patients History and Physical, chart, labs and discussed the procedure including the risks, benefits and alternatives for the proposed anesthesia with the patient or authorized representative who has indicated his/her understanding and acceptance.   Dental advisory given  Plan Discussed with: CRNA and Surgeon  Anesthesia Plan Comments:         Anesthesia Quick Evaluation

## 2016-04-06 NOTE — H&P (Signed)
The patient has been re-examined, and the chart reviewed, and there have been no interval changes to the documented history and physical.    The risks, benefits, and alternatives have been discussed at length. The patient expressed understanding of the risks benefits and agreed with plans for surgical intervention.  James P. Hooten, Jr. M.D.    

## 2016-04-06 NOTE — Discharge Instructions (Signed)
°  Instructions after Hand / Wrist Surgery ° ° Heather Phelps, Jr., M.D. ° Dept. of Orthopaedics & Sports Medicine ° [facility] ° [address] ° [place], [place]  [address] ° ° Phone: [phone]   Fax: [phone] ° ° °DIET: °• Drink plenty of non-alcoholic fluids & begin a light diet. °• Resume your normal diet the day after surgery. ° °ACTIVITY:  °• Keep the hand elevated above the level of the elbow. °• Begin gently moving the fingers on a regular basis to avoid stiffness. °• Avoid any heavy lifting, pushing, or pulling with the operative hand. °• Do not drive or operate any equipment until instructed. ° °WOUND CARE:  °• Keep the splint/bandage clean and dry.  °• The splint and stitches will be removed in the office. °• Continue to use the ice packs periodically to reduce pain and swelling. °• You may bathe or shower after the stitches are removed at the first office visit following surgery. ° °MEDICATIONS: °• You may resume your regular medications. °• Please take the pain medication as prescribed. °• Do not take pain medication on an empty stomach. °• Do not drive or drink alcoholic beverages when taking pain medications. ° °CALL THE OFFICE FOR: °• Temperature above 101 degrees °• Excessive bleeding or drainage on the dressing. °• Excessive swelling, coldness, or paleness of the fingers. °• Persistent nausea and vomiting. ° °FOLLOW-UP:  °• You should have an appointment to return to the office in 7-10 days after surgery.  ° °REMEMBER: R.I.C.E. = Rest, Ice, Compression, Elevation !  °

## 2016-04-06 NOTE — Anesthesia Procedure Notes (Addendum)
Procedure Name: Intubation Date/Time: 04/06/2016 7:01 PM Performed by: Waldo LaineJUSTIS, Icie Kuznicki Pre-anesthesia Checklist: Patient identified, Patient being monitored, Timeout performed, Emergency Drugs available and Suction available Patient Re-evaluated:Patient Re-evaluated prior to inductionOxygen Delivery Method: Circle system utilized Preoxygenation: Pre-oxygenation with 100% oxygen Intubation Type: IV induction Ventilation: Mask ventilation without difficulty Laryngoscope Size: Miller and 2 Grade View: Grade I Tube type: Oral Tube size: 7.0 mm Number of attempts: 1 Airway Equipment and Method: Stylet Placement Confirmation: ETT inserted through vocal cords under direct vision,  positive ETCO2 and breath sounds checked- equal and bilateral Secured at: 19 cm Tube secured with: Tape Dental Injury: Teeth and Oropharynx as per pre-operative assessment

## 2016-04-07 ENCOUNTER — Encounter: Payer: Self-pay | Admitting: Orthopedic Surgery

## 2016-05-02 ENCOUNTER — Encounter: Payer: Self-pay | Admitting: Orthopedic Surgery

## 2016-05-02 NOTE — Anesthesia Post-op Follow-up Note (Cosign Needed)
Anesthesia QCDR form completed.        

## 2016-05-02 NOTE — Transfer of Care (Signed)
Immediate Anesthesia Transfer of Care Note  Patient: Heather Phelps  Procedure(s) Performed: Procedure(s): RELEASE DORSAL COMPARTMENT (DEQUERVAIN) (Left)  Patient Location: PACU  Anesthesia Type:General  Level of Consciousness: responds to stimulation  Airway & Oxygen Therapy: Patient Spontanous Breathing  Post-op Assessment: Report given to RN and Post -op Vital signs reviewed and stable  Post vital signs: Reviewed and stable  Last Vitals:  Vitals:   04/06/16 2037 04/06/16 2056  BP: (!) 154/82 (!) 158/68  Pulse: 74 72  Resp: (!) 22 20  Temp: 36.7 C 36.8 C    Last Pain:  Vitals:   04/07/16 1125  TempSrc:   PainSc: 0-No pain         Complications: No apparent anesthesia complications

## 2016-05-10 NOTE — Anesthesia Postprocedure Evaluation (Signed)
Anesthesia Post Note  Patient: Arlis PortaBrenda Y Veals  Procedure(s) Performed: Procedure(s) (LRB): RELEASE DORSAL COMPARTMENT (DEQUERVAIN) (Left)  Patient location during evaluation: PACU Anesthesia Type: General Level of consciousness: awake and alert and oriented Pain management: pain level controlled Vital Signs Assessment: post-procedure vital signs reviewed and stable Respiratory status: spontaneous breathing Cardiovascular status: blood pressure returned to baseline Anesthetic complications: no     Last Vitals:  Vitals:   04/06/16 2037 04/06/16 2056  BP: (!) 154/82 (!) 158/68  Pulse: 74 72  Resp: (!) 22 20  Temp: 36.7 C 36.8 C    Last Pain:  Vitals:   04/07/16 1125  TempSrc:   PainSc: 0-No pain                 Carli Lefevers

## 2017-01-26 ENCOUNTER — Other Ambulatory Visit: Payer: Self-pay | Admitting: Family Medicine

## 2017-01-26 DIAGNOSIS — Z1231 Encounter for screening mammogram for malignant neoplasm of breast: Secondary | ICD-10-CM

## 2017-02-21 ENCOUNTER — Ambulatory Visit
Admission: RE | Admit: 2017-02-21 | Discharge: 2017-02-21 | Disposition: A | Discharge status: Home / Self Care | Payer: Medicare Other | Source: Ambulatory Visit | Attending: Family Medicine | Admitting: Family Medicine

## 2017-02-21 DIAGNOSIS — Z1231 Encounter for screening mammogram for malignant neoplasm of breast: Secondary | ICD-10-CM | POA: Insufficient documentation

## 2017-05-01 ENCOUNTER — Other Ambulatory Visit: Payer: Self-pay | Admitting: Family Medicine

## 2017-05-01 DIAGNOSIS — Z780 Asymptomatic menopausal state: Secondary | ICD-10-CM

## 2017-09-13 ENCOUNTER — Other Ambulatory Visit: Payer: Self-pay | Admitting: Specialist

## 2017-09-13 DIAGNOSIS — R0602 Shortness of breath: Secondary | ICD-10-CM

## 2017-12-11 ENCOUNTER — Other Ambulatory Visit: Payer: Self-pay | Admitting: Family Medicine

## 2017-12-11 DIAGNOSIS — Z1231 Encounter for screening mammogram for malignant neoplasm of breast: Secondary | ICD-10-CM

## 2018-01-24 ENCOUNTER — Other Ambulatory Visit: Payer: Self-pay | Admitting: Specialist

## 2018-01-24 DIAGNOSIS — R911 Solitary pulmonary nodule: Secondary | ICD-10-CM

## 2018-01-24 DIAGNOSIS — J449 Chronic obstructive pulmonary disease, unspecified: Secondary | ICD-10-CM

## 2018-01-24 DIAGNOSIS — R0609 Other forms of dyspnea: Principal | ICD-10-CM

## 2018-01-30 ENCOUNTER — Encounter (INDEPENDENT_AMBULATORY_CARE_PROVIDER_SITE_OTHER): Payer: Self-pay

## 2018-01-30 ENCOUNTER — Ambulatory Visit
Admission: RE | Admit: 2018-01-30 | Discharge: 2018-01-30 | Disposition: A | Discharge status: Home / Self Care | Payer: Medicare Other | Source: Ambulatory Visit | Attending: Specialist | Admitting: Specialist

## 2018-01-30 DIAGNOSIS — R911 Solitary pulmonary nodule: Secondary | ICD-10-CM | POA: Diagnosis present

## 2018-01-30 DIAGNOSIS — R0609 Other forms of dyspnea: Secondary | ICD-10-CM | POA: Diagnosis present

## 2018-01-30 DIAGNOSIS — J449 Chronic obstructive pulmonary disease, unspecified: Secondary | ICD-10-CM | POA: Insufficient documentation

## 2018-02-16 ENCOUNTER — Other Ambulatory Visit: Payer: Self-pay

## 2018-02-16 ENCOUNTER — Ambulatory Visit
Admission: EM | Admit: 2018-02-16 | Discharge: 2018-02-16 | Disposition: A | Discharge status: Home / Self Care | Payer: Medicare Other | Attending: Family Medicine | Admitting: Family Medicine

## 2018-02-16 ENCOUNTER — Encounter: Payer: Self-pay | Admitting: Emergency Medicine

## 2018-02-16 DIAGNOSIS — M79605 Pain in left leg: Secondary | ICD-10-CM

## 2018-02-16 DIAGNOSIS — R109 Unspecified abdominal pain: Secondary | ICD-10-CM

## 2018-02-16 DIAGNOSIS — R42 Dizziness and giddiness: Secondary | ICD-10-CM

## 2018-02-16 DIAGNOSIS — M62838 Other muscle spasm: Secondary | ICD-10-CM

## 2018-02-16 MED ORDER — BACLOFEN 10 MG PO TABS: 5.0000 mg | ORAL_TABLET | Freq: Three times a day (TID) | ORAL | 0 refills | Status: DC

## 2018-02-16 MED ORDER — MELOXICAM 15 MG PO TABS: 15.0000 mg | ORAL_TABLET | Freq: Every day | ORAL | 0 refills | Status: DC | PRN

## 2018-02-16 MED ORDER — MECLIZINE HCL 25 MG PO TABS: 25.0000 mg | ORAL_TABLET | Freq: Three times a day (TID) | ORAL | 0 refills | Status: DC | PRN

## 2018-02-16 NOTE — Discharge Instructions (Addendum)
Medications as prescribed.  Follow up with your PCP  Take care  Dr. Britian Jentz  

## 2018-02-16 NOTE — ED Provider Notes (Signed)
[facility]    CSN: [id number] Arrival date & time: 02/16/18  1549   History   Chief Complaint Chief Complaint  Patient presents with  . Dizziness  . Abdominal Pain   HPI  68 year old female presents with multiple complaints.  Leg pain  Patient reports that she has had left leg pain for the past 3 weeks.  States that it starts proximally and then extends distally.  Has a history of arthritis.  Worse with activity.  No relieving factors.  No medication interventions tried.  Abdominal pain  Patient states that she has had upper abdominal pain for the past 2 years  Patient states that it occurs only when she bends down.  No reports of fever  Relieved with rest  No other associated symptoms  Dizziness  1 month history of intermittent dizziness  She describes it as objects spinning  Worse with certain movements  Improves with rest  Neck pain  Left-sided neck pain  Has been persistent over the past 2 weeks  Worse with range of motion  No medications or interventions tried  No radicular symptoms.  No other associated symptoms  Past Medical History:  Diagnosis Date  . Anxiety   . Arthritis   . Bronchitis   . Depression   . GERD (gastroesophageal reflux disease)   . Hyperlipidemia   . Hypertension   . Restless leg syndrome   . Thrombocytopenia (HCC)     There are no active problems to display for this patient.   Past Surgical History:  Procedure Laterality Date  . ABDOMINAL HYSTERECTOMY    . BREAST BIOPSY Right 01/08/2001   neg  . BREAST CYST ASPIRATION Left 06/19/2001   neg  . BREAST EXCISIONAL BIOPSY Left    benign per pt  . DORSAL COMPARTMENT RELEASE Left 04/06/2016   Procedure: RELEASE DORSAL COMPARTMENT (DEQUERVAIN);  Surgeon: Donato Heinz, MD;  Location: [facility] ORS;  Service: Orthopedics;  Laterality: Left;  Marland Kitchen VENTRAL HERNIA REPAIR N/A 12/04/2015   Procedure: HERNIA REPAIR VENTRAL ADULT;  Surgeon: Nadeen Landau,  MD;  Location: [facility] ORS;  Service: General;  Laterality: N/A;    OB History   No obstetric history on file.      Home Medications    Prior to Admission medications   Medication Sig Start Date End Date Taking? Authorizing Provider  albuterol (PROVENTIL HFA;VENTOLIN HFA) 108 (90 Base) MCG/ACT inhaler Inhale 2 puffs into the lungs every 6 (six) hours as needed for wheezing or shortness of breath.   Yes [provider]  aspirin EC 81 MG tablet Take 81 mg by mouth daily.   Yes [provider]  gabapentin (NEURONTIN) 300 MG capsule Take 300 mg by mouth at bedtime.   Yes [provider]  ipratropium (ATROVENT) 0.06 % nasal spray Place 2 sprays into both nostrils 3 (three) times daily as needed for rhinitis.   Yes [provider]  irbesartan (AVAPRO) 150 MG tablet Take by mouth. 05/10/17 05/10/18 Yes [provider]  lovastatin (MEVACOR) 40 MG tablet Take 40 mg by mouth at bedtime.   Yes [provider]  magnesium oxide (MAG-OX) 400 MG tablet Take 400 mg by mouth every morning.    Yes [provider]  metoprolol succinate (TOPROL-XL) 50 MG 24 hr tablet Take by mouth. 03/30/17  Yes [provider]  omeprazole (PRILOSEC) 20 MG capsule Take 20 mg by mouth every morning.    Yes [provider]  traZODone (DESYREL) 150 MG tablet  Take 75 mg by mouth at bedtime as needed for sleep.   Yes [provider]  baclofen (LIORESAL) 10 MG tablet Take 0.5-1 tablets (5-10 mg total) by mouth 3 (three) times daily. 02/16/18   Tommie Samsook, Karin Pinedo G, DO  meclizine (ANTIVERT) 25 MG tablet Take 1 tablet (25 mg total) by mouth 3 (three) times daily as needed for dizziness. 02/16/18   Tommie Samsook, Karilynn Carranza G, DO  meloxicam (MOBIC) 15 MG tablet Take 1 tablet (15 mg total) by mouth daily as needed. 02/16/18   Tommie Samsook, Ludia Gartland G, DO    Family History Family History  Problem Relation Age of Onset  . Congestive Heart Failure Mother   . Pancreatic cancer Father   .  Hypertension Father   . Breast cancer Neg Hx     Social History Social History   Tobacco Use  . Smoking status: Former Smoker    Packs/day: 0.25    Years: 25.00    Pack years: 6.25    Types: Cigarettes    Last attempt to quit: 02/16/2017    Years since quitting: 1.0  . Smokeless tobacco: Never Used  Substance Use Topics  . Alcohol use: Yes    Comment: occassional  . Drug use: No     Allergies   Other; Hctz [hydrochlorothiazide]; Septra [sulfamethoxazole-trimethoprim]; and Tetracyclines & related   Review of Systems Review of Systems Per HPI  Physical Exam Triage Vital Signs ED Triage Vitals  Enc Vitals Group     BP 02/16/18 1607 139/63     Pulse Rate 02/16/18 1607 (!) 59     Resp 02/16/18 1607 16     Temp 02/16/18 1607 98.3 F (36.8 C)     Temp Source 02/16/18 1607 Oral     SpO2 02/16/18 1607 98 %     Weight 02/16/18 1609 160 lb (72.6 kg)     Height 02/16/18 1609 5' 2.5" (1.588 m)     Head Circumference --      Peak Flow --      Pain Score 02/16/18 1606 10     Pain Loc --      Pain Edu? --      Excl. in GC? --    Updated Vital Signs BP 139/63 (BP Location: Right Arm)   Pulse (!) 59   Temp 98.3 F (36.8 C) (Oral)   Resp 16   Ht 5' 2.5" (1.588 m)   Wt 72.6 kg   SpO2 98%   BMI 28.80 kg/m   Visual Acuity Right Eye Distance:   Left Eye Distance:   Bilateral Distance:    Right Eye Near:   Left Eye Near:    Bilateral Near:     Physical Exam Vitals signs and nursing note reviewed.  Constitutional:      General: She is not in acute distress. HENT:     Head: Normocephalic and atraumatic.  Neck:     Comments: Left trapezius tenderness to palpation. Cardiovascular:     Rate and Rhythm: Normal rate and regular rhythm.  Pulmonary:     Effort: Pulmonary effort is normal.     Breath sounds: No wheezing, rhonchi or rales.  Abdominal:     General: There is no distension.     Palpations: Abdomen is soft.     Tenderness: There is no abdominal  tenderness.  Musculoskeletal:     Comments: Left hip -normal range of motion.  Neurological:     Mental Status: She is alert.  Psychiatric:  Mood and Affect: Mood normal.        Behavior: Behavior normal.    UC Treatments / Results  Labs (all labs ordered are listed, but only abnormal results are displayed) Labs Reviewed - No data to display  EKG None  Radiology No results found.  Procedures Procedures (including critical care time)  Medications Ordered in UC Medications - No data to display  Initial Impression / Assessment and Plan / UC Course  I have reviewed the triage vital signs and the nursing notes.  Pertinent labs & imaging results that were available during my care of the patient were reviewed by me and considered in my medical decision making (see chart for details).    68 year old female presents with multiple complaints.  Clinically, patient has vertigo.  Treating with as needed meclizine.  Her abdominal pain is secondary to abdominal wall pain.  Advised supportive care and avoidance of certain activities.  Pain in her left lower extremity is likely coming from her low back.  Treating with meloxicam.  Neck pain secondary to muscle spasm.  Baclofen as needed.  Final Clinical Impressions(s) / UC Diagnoses   Final diagnoses:  Vertigo  Abdominal wall pain  Pain of left lower extremity  Neck muscle spasm     Discharge Instructions     Medications as prescribed.  Follow up with your PCP.  Take care  Dr. Adriana Simasook    ED Prescriptions    Medication Sig Dispense Auth. Provider   meclizine (ANTIVERT) 25 MG tablet Take 1 tablet (25 mg total) by mouth 3 (three) times daily as needed for dizziness. 30 tablet Caelin Rosen G, DO   meloxicam (MOBIC) 15 MG tablet Take 1 tablet (15 mg total) by mouth daily as needed. 30 tablet Darriel Utter G, DO   baclofen (LIORESAL) 10 MG tablet Take 0.5-1 tablets (5-10 mg total) by mouth 3 (three) times daily. 30 each Tommie Samsook,  Khaliel Morey G, DO     Controlled Substance Prescriptions [place] Controlled Substance Registry consulted? Not Applicable   Tommie SamsCook, Deonne Rooks G, DO 02/16/18 1925

## 2018-02-16 NOTE — ED Triage Notes (Signed)
Patient in today c/o vertigo x 2 weeks. Patient was seen at PCP and was treated for a sinus problem. Patient states her sinus problem is better, but getting dizzy when she moves her head a certain way.  Patient also c/o upper abdominal pain x 2 years. Patient states it has been hurting this time since Christmas. Patient has not tried any OTC medication. Patient states it only bothers her when she bends over to tie her shoes.  Patient also c/o left leg pain x 3 weeks. Patient denies injury. Patient has not tried any OTC medication.

## 2018-02-22 ENCOUNTER — Ambulatory Visit
Admission: RE | Admit: 2018-02-22 | Discharge: 2018-02-22 | Disposition: A | Discharge status: Home / Self Care | Payer: Medicare Other | Source: Ambulatory Visit | Attending: Family Medicine | Admitting: Family Medicine

## 2018-02-22 DIAGNOSIS — Z1231 Encounter for screening mammogram for malignant neoplasm of breast: Secondary | ICD-10-CM | POA: Diagnosis present

## 2018-03-15 ENCOUNTER — Other Ambulatory Visit: Payer: Self-pay | Admitting: Family Medicine

## 2018-06-13 ENCOUNTER — Other Ambulatory Visit: Payer: Self-pay | Admitting: Gastroenterology

## 2018-06-13 DIAGNOSIS — R1312 Dysphagia, oropharyngeal phase: Secondary | ICD-10-CM

## 2018-07-06 ENCOUNTER — Ambulatory Visit: Admit: 2018-07-06 | Payer: Medicare Other | Admitting: Gastroenterology

## 2018-07-06 SURGERY — COLONOSCOPY WITH PROPOFOL
Anesthesia: General

## 2018-08-02 ENCOUNTER — Ambulatory Visit: Payer: Medicare Other | Attending: Gastroenterology

## 2018-09-20 ENCOUNTER — Other Ambulatory Visit: Payer: Self-pay | Admitting: Internal Medicine

## 2018-09-20 ENCOUNTER — Other Ambulatory Visit (HOSPITAL_COMMUNITY): Payer: Self-pay | Admitting: Internal Medicine

## 2018-09-20 DIAGNOSIS — M25552 Pain in left hip: Secondary | ICD-10-CM

## 2018-09-20 DIAGNOSIS — G8929 Other chronic pain: Secondary | ICD-10-CM

## 2018-10-01 ENCOUNTER — Ambulatory Visit
Admission: RE | Admit: 2018-10-01 | Discharge: 2018-10-01 | Disposition: A | Discharge status: Home / Self Care | Payer: Medicare Other | Source: Ambulatory Visit | Attending: Internal Medicine | Admitting: Internal Medicine

## 2018-10-01 ENCOUNTER — Other Ambulatory Visit: Payer: Self-pay

## 2018-10-01 DIAGNOSIS — G8929 Other chronic pain: Secondary | ICD-10-CM | POA: Diagnosis present

## 2018-10-01 DIAGNOSIS — M25552 Pain in left hip: Secondary | ICD-10-CM | POA: Diagnosis not present

## 2018-11-27 ENCOUNTER — Other Ambulatory Visit: Payer: Self-pay | Admitting: Family Medicine

## 2018-12-05 ENCOUNTER — Other Ambulatory Visit: Payer: Self-pay | Admitting: Family Medicine

## 2018-12-06 NOTE — Progress Notes (Signed)
[facility]  [address] [place], [place] [address] Phone: [phone]  Fax: [phone]   Clinic Day:  12/07/2018  Referring physician: Valera Castle, *  Chief Complaint: Heather Phelps is a 68 y.o. female with left breast pain who is referred in consultation by Dr. Johny Drilling for assessment and management.   HPI:  The patient undergoes yearly mammograms.  She presented to Dr. Kym Groom on 11/21/2018 for left breast pain. She noted increasing left breast pain for several months. Patient noticed the area was tender and radiated to the axilla. She denied any trauma.  Bilateral screening mammogram on 02/22/2018 revealed no evidence of malignancy.  Exam showed a very tender elongated soft mass in the upper quadrants of the left breast.  The etiology of the mass was uncertain (cyst versus a benign lesion versus malignant mass). Diagnostic mammogram and ultrasound were ordered.   Symptomatically, she feels "good".  She notes an ongoing sharp pain that runs through her left breast. Her left breast is tender to the touch.  She denies lifting anything heavy or trauma.  Pain is in her breast and not chest wall or deeper.  Her pain level is unchanged. To relieve pain, she takes Tylenol #3 nightly. Tylenol #3 has been presribed to help for her left groin pain, but also relieves breast pain.  Notes by Dr. 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  Her weight fluctuates between 160-165 pounds. She notes visual changes. She has occasional shortness of breath upon exertion. She notes mild constipation for a few days then she will have a bowel movement. She has right knee pain and left hip pain.   Menses started at [age] years old.  She notes a right tubal pregnancy and problems with her left ovary.  She had menopause at age 72 s/p total hysterectomy.  She was on  hormone replace for many years.   She has a history of COPD and is being followed by Dr. Raul Del.  CXR on 12/06/2018 was "good" (no report available). Oxygen at rest was 87% and with 2 liters it went up to 97%. She began supplemental oxygen on 12/05/2018.   Patient has osteoarthritis involving multiple joints and is being followed by rheumatologist Dr. Annalee Genta.    Past Medical History:  Diagnosis Date  . Anxiety   . Arthritis   . Bronchitis   . Depression   . GERD (gastroesophageal reflux disease)   . Hyperlipidemia   . Hypertension   . Restless leg syndrome   . Thrombocytopenia ([place])     Past Surgical History:  Procedure Laterality Date  . ABDOMINAL HYSTERECTOMY    . BREAST CYST ASPIRATION Left 06/19/2001   neg  . BREAST EXCISIONAL BIOPSY Right 01/08/2001   neg  . BREAST EXCISIONAL BIOPSY Left    benign per pt  . DORSAL COMPARTMENT RELEASE Left 04/06/2016   Procedure: RELEASE DORSAL COMPARTMENT (DEQUERVAIN);  Surgeon: Dereck Leep, MD;  Location: [facility] ORS;  Service: Orthopedics;  Laterality: Left;  . OOPHORECTOMY    . VENTRAL HERNIA REPAIR N/A 12/04/2015   Procedure: HERNIA REPAIR VENTRAL ADULT;  Surgeon: Leonie Green, MD;  Location: [facility] ORS;  Service: General;  Laterality: N/A;    Family History  Problem Relation Age of Onset  . Congestive Heart Failure Mother   . Pancreatic cancer Father   . Hypertension Father   . Breast cancer  Neg Hx    There is no known breast or ovarian cancer in her family. Her identical triplet sister died from lung cancer related to smoking. Her other identical triplet sister died from a clot. She has another sister who is not a triplet. She has 4 brothers. Three of the brothers have COPD. Her mother had CHF.   Social History:  She smoked for 1 pack/day x 38 years. She quit smoking on 12/01/2016 because of bronchitis.  She has never used smokeless tobacco. She drinks wine every now and then. She denies any exposure to radiation or  toxins. She is retired. She used to work in the [employer]. She has one adopted daughter. The patient is alone today.  Allergies:  Allergies  Allergen Reactions  . Other     corticosteroids (glucocorticoids) Oral methylprednisolone caused hypertensive urgency - use with caution  . Hctz [Hydrochlorothiazide] Other (See Comments)    "severe leg cramps"  . Septra [Sulfamethoxazole-Trimethoprim] Itching  . Tetracyclines & Related Itching and Rash    Current Medications: Current Outpatient Medications  Medication Sig Dispense Refill  . acetaminophen-codeine (TYLENOL #3) 300-30 MG tablet Take by mouth.    Marland Kitchen. aspirin EC 81 MG tablet Take 81 mg by mouth daily.    Marland Kitchen. gabapentin (NEURONTIN) 300 MG capsule Take 300 mg by mouth at bedtime.    Marland Kitchen. ipratropium (ATROVENT) 0.06 % nasal spray Place 2 sprays into both nostrils 3 (three) times daily as needed for rhinitis.    Marland Kitchen. lovastatin (MEVACOR) 40 MG tablet Take 40 mg by mouth at bedtime.    . magnesium oxide (MAG-OX) 400 MG tablet Take 400 mg by mouth every morning.     . metoprolol succinate (TOPROL-XL) 50 MG 24 hr tablet Take by mouth.    Marland Kitchen. omeprazole (PRILOSEC) 20 MG capsule Take 20 mg by mouth every morning.     . traZODone (DESYREL) 150 MG tablet Take 75 mg by mouth at bedtime as needed for sleep.    Marland Kitchen. albuterol (PROVENTIL HFA;VENTOLIN HFA) 108 (90 Base) MCG/ACT inhaler Inhale 2 puffs into the lungs every 6 (six) hours as needed for wheezing or shortness of breath.    . baclofen (LIORESAL) 10 MG tablet Take 0.5-1 tablets (5-10 mg total) by mouth 3 (three) times daily. (Patient not taking: Reported on 12/07/2018) 30 each 0  . irbesartan (AVAPRO) 150 MG tablet Take by mouth.    . meclizine (ANTIVERT) 25 MG tablet Take 1 tablet (25 mg total) by mouth 3 (three) times daily as needed for dizziness. (Patient not taking: Reported on 12/07/2018) 30 tablet 0  . meloxicam (MOBIC) 15 MG tablet Take 1 tablet (15 mg total) by mouth daily as needed. (Patient  not taking: Reported on 12/07/2018) 30 tablet 0   No current facility-administered medications for this visit.     Review of Systems  Constitutional: Negative.  Negative for chills, diaphoresis, fever, malaise/fatigue and weight loss (weight fluctuates between 160-165 pounds).       Feels "good".  HENT: Negative.  Negative for congestion, ear discharge, ear pain, hearing loss, nosebleeds, sinus pain and sore throat.   Eyes: Negative for blurred vision, double vision and photophobia.       Visual changes (needs to go to the ophthalmologist).  Respiratory: Positive for shortness of breath (on exertion). Negative for cough, hemoptysis and sputum production.        COPD.  Cardiovascular: Positive for chest pain (left breast). Negative for palpitations, orthopnea, leg swelling and PND.  Gastrointestinal: Positive for constipation (mild). Negative for abdominal pain, blood in stool, diarrhea, melena, nausea and vomiting.  Genitourinary: Negative.  Negative for dysuria, frequency, hematuria and urgency.  Musculoskeletal: Positive for joint pain (right knee, left hip). Negative for back pain, myalgias and neck pain.  Skin: Negative.  Negative for itching and rash.  Neurological: Negative.  Negative for dizziness, tingling, sensory change, focal weakness, weakness and headaches.  Endo/Heme/Allergies: Negative.  Does not bruise/bleed easily.  Psychiatric/Behavioral: Negative.  Negative for depression and memory loss. The patient is not nervous/anxious and does not have insomnia.   All other systems reviewed and are negative.  Performance status (ECOG): 1  Vitals Blood pressure (!) 143/98, pulse (!) 50, temperature (!) 97.2 F (36.2 C), temperature source Tympanic, resp. rate 18, height 5' 2.5" (1.588 m), weight 164 lb 5.7 oz (74.6 kg), SpO2 95 %.  Physical Exam  Constitutional: She is oriented to person, place, and time. She appears well-developed and well-nourished. No distress.  HENT:  Head:  Normocephalic and atraumatic.  Mouth/Throat: Oropharynx is clear and moist. No oropharyngeal exudate.  Black cap.  Brown hair with blonde rinse.  Dentures.  Mask.  Eyes: Pupils are equal, round, and reactive to light. Conjunctivae and EOM are normal. No scleral icterus.  Glasses.  Brown eyes.  Arcus senilis.  Neck: Normal range of motion. Neck supple. No JVD present.  Cardiovascular: Normal rate, regular rhythm and normal heart sounds.  No murmur heard. Pulmonary/Chest: Effort normal and breath sounds normal. No respiratory distress. She has no wheezes. She has no rales. She exhibits no tenderness. Right breast exhibits tenderness. Right breast exhibits no skin change (fibrocystic changes 12 o'clock - 2 o'clock, old well healed biopsy site 8 o'clock). Left breast exhibits tenderness (extremely at 10 o'clock - 2 o'clock). Left breast exhibits no skin change (fibrocystic changes 10 o'clock - 2 o'clock).  Abdominal: Soft. Bowel sounds are normal. She exhibits no distension and no mass. There is no abdominal tenderness. There is no rebound and no guarding.  Musculoskeletal: Normal range of motion.        General: Tenderness (lower extremities) present. No edema.  Lymphadenopathy:       Head (right side): No preauricular, no posterior auricular and no occipital adenopathy present.       Head (left side): No preauricular, no posterior auricular and no occipital adenopathy present.    She has no cervical adenopathy.    She has no axillary adenopathy.       Right: No inguinal and no supraclavicular adenopathy present.       Left: No inguinal and no supraclavicular adenopathy present.  Neurological: She is alert and oriented to person, place, and time.  Skin: Skin is warm and dry. No rash noted. She is not diaphoretic. No erythema. No pallor.  Psychiatric: She has a normal mood and affect. Her behavior is normal. Judgment and thought content normal.  Nursing note and vitals reviewed.   No visits with  results within 3 Day(s) from this visit.  Latest known visit with results is:  Hospital Outpatient Visit on 04/01/2016  Component Date Value Ref Range Status  . WBC 04/01/2016 5.5  3.6 - 11.0 K/uL Final  . RBC 04/01/2016 4.41  3.80 - 5.20 MIL/uL Final  . Hemoglobin 04/01/2016 13.4  12.0 - 16.0 g/dL Final  . HCT 78/29/5621 40.8  35.0 - 47.0 % Final  . MCV 04/01/2016 92.5  80.0 - 100.0 fL Final  . MCH 04/01/2016 30.5  26.0 -  34.0 pg Final  . MCHC 04/01/2016 32.9  32.0 - 36.0 g/dL Final  . RDW 68/01/7516 16.0* 11.5 - 14.5 % Final  . Platelets 04/01/2016 169  150 - 440 K/uL Final  . MRSA, PCR 04/01/2016 NEGATIVE  NEGATIVE Final  . Staphylococcus aureus 04/01/2016 NEGATIVE  NEGATIVE Final   Comment:        The Xpert SA Assay (FDA approved for NASAL specimens in patients over 34 years of age), is one component of a comprehensive surveillance program.  Test performance has been validated by [facility] for patients greater than or equal to 77 year old. It is not intended to diagnose infection nor to guide or monitor treatment.     Assessment:  Heather Phelps is a 68 y.o. female with left breast pain for several months. She denied any trauma.    Bilateral screening mammogram on 02/22/2018 revealed no evidence of malignancy.    She has COPD.  CXR on 12/06/2018 was "good" (no report available). Oxygen at rest was 87% and with 2 liters it went up to 97%. She is on supplemental oxygen.    She has osteoarthritis involving multiple joints,  tendinopathy of the left gluteus medius, and left hip impingement syndrome.  Symptomatically, she feels "good".  She notes an ongoing sharp pain that runs through her left breast.   Exam suggests tender fibrocystic changes.  Plan: 1.   Left breast mastodynia  Etiology appears secondary to tender fibrocystic changes.  Left breast exam is reassuring.  Bilateral screening mammogram on 02/22/2018 was normal.   Breast tissue was extremely dense  lowering the sensitivity of the mammogram.  Discuss plan for left diagnostic mammogram and ultrasound.   Phone follow-up with Dr. Zada Finders after mammogram.  If concern remains, consider breast MRI. 2.   RTC prn.      I discussed the assessment and treatment plan with the patient.  The patient was provided an opportunity to ask questions and all were answered.  The patient agreed with the plan and demonstrated an understanding of the instructions.  The patient was advised to call back if the symptoms worsen or if the condition fails to improve as anticipated.  I provided 29 minutes of face-to-face time during this this encounter and > 50% was spent counseling as documented under my assessment and plan.     C. Merlene Pulling, MD, PhD    12/07/2018, 10:26 AM  I, Theador Hawthorne, am acting as scribe for  C. Merlene Pulling, MD, PhD.  I,  C. Merlene Pulling, MD, have reviewed the above documentation for accuracy and completeness, and I agree with the above.

## 2018-12-07 ENCOUNTER — Encounter: Payer: Self-pay | Admitting: Hematology and Oncology

## 2018-12-07 ENCOUNTER — Other Ambulatory Visit: Payer: Self-pay

## 2018-12-07 ENCOUNTER — Inpatient Hospital Stay: Payer: Medicare Other | Attending: Hematology and Oncology | Admitting: Hematology and Oncology

## 2018-12-07 VITALS — BP 143/98 | HR 50 | Temp 97.2°F | Resp 18 | Ht 62.5 in | Wt 164.4 lb

## 2018-12-07 DIAGNOSIS — Z87891 Personal history of nicotine dependence: Secondary | ICD-10-CM

## 2018-12-07 DIAGNOSIS — Z90710 Acquired absence of both cervix and uterus: Secondary | ICD-10-CM

## 2018-12-07 DIAGNOSIS — N644 Mastodynia: Secondary | ICD-10-CM | POA: Diagnosis present

## 2018-12-07 DIAGNOSIS — J449 Chronic obstructive pulmonary disease, unspecified: Secondary | ICD-10-CM | POA: Diagnosis not present

## 2018-12-07 DIAGNOSIS — Z800 Family history of malignant neoplasm of digestive organs: Secondary | ICD-10-CM | POA: Insufficient documentation

## 2018-12-20 ENCOUNTER — Other Ambulatory Visit: Payer: Self-pay | Admitting: Family Medicine

## 2018-12-20 DIAGNOSIS — N644 Mastodynia: Secondary | ICD-10-CM

## 2018-12-26 ENCOUNTER — Other Ambulatory Visit: Payer: Self-pay | Admitting: Family Medicine

## 2018-12-26 ENCOUNTER — Ambulatory Visit
Admission: RE | Admit: 2018-12-26 | Discharge: 2018-12-26 | Disposition: A | Discharge status: Home / Self Care | Payer: Medicare Other | Source: Ambulatory Visit | Attending: Family Medicine | Admitting: Family Medicine

## 2018-12-26 DIAGNOSIS — N644 Mastodynia: Secondary | ICD-10-CM | POA: Insufficient documentation

## 2019-01-01 ENCOUNTER — Other Ambulatory Visit
Admission: RE | Admit: 2019-01-01 | Discharge: 2019-01-01 | Disposition: A | Discharge status: Home / Self Care | Payer: Medicare Other | Source: Ambulatory Visit | Attending: General Surgery | Admitting: General Surgery

## 2019-01-01 ENCOUNTER — Other Ambulatory Visit: Payer: Self-pay

## 2019-01-01 DIAGNOSIS — Z20828 Contact with and (suspected) exposure to other viral communicable diseases: Secondary | ICD-10-CM | POA: Insufficient documentation

## 2019-01-01 DIAGNOSIS — Z01812 Encounter for preprocedural laboratory examination: Secondary | ICD-10-CM | POA: Diagnosis present

## 2019-01-01 LAB — SARS CORONAVIRUS 2 (TAT 6-24 HRS): SARS Coronavirus 2: NEGATIVE

## 2019-01-03 ENCOUNTER — Encounter: Payer: Self-pay | Admitting: *Deleted

## 2019-01-04 ENCOUNTER — Encounter
Admission: RE | Disposition: A | Discharge status: Home / Self Care | Payer: Self-pay | Source: Home / Self Care | Attending: General Surgery

## 2019-01-04 ENCOUNTER — Ambulatory Visit
Admission: RE | Admit: 2019-01-04 | Discharge: 2019-01-04 | Disposition: A | Discharge status: Home / Self Care | Payer: Medicare Other | Attending: General Surgery | Admitting: General Surgery

## 2019-01-04 ENCOUNTER — Ambulatory Visit: Payer: Medicare Other | Admitting: Registered Nurse

## 2019-01-04 ENCOUNTER — Other Ambulatory Visit: Payer: Self-pay

## 2019-01-04 DIAGNOSIS — Z791 Long term (current) use of non-steroidal anti-inflammatories (NSAID): Secondary | ICD-10-CM | POA: Insufficient documentation

## 2019-01-04 DIAGNOSIS — I10 Essential (primary) hypertension: Secondary | ICD-10-CM | POA: Insufficient documentation

## 2019-01-04 DIAGNOSIS — K219 Gastro-esophageal reflux disease without esophagitis: Secondary | ICD-10-CM | POA: Diagnosis not present

## 2019-01-04 DIAGNOSIS — R1013 Epigastric pain: Secondary | ICD-10-CM | POA: Diagnosis present

## 2019-01-04 DIAGNOSIS — F419 Anxiety disorder, unspecified: Secondary | ICD-10-CM | POA: Diagnosis not present

## 2019-01-04 DIAGNOSIS — E785 Hyperlipidemia, unspecified: Secondary | ICD-10-CM | POA: Diagnosis not present

## 2019-01-04 DIAGNOSIS — Z87891 Personal history of nicotine dependence: Secondary | ICD-10-CM | POA: Diagnosis not present

## 2019-01-04 DIAGNOSIS — F329 Major depressive disorder, single episode, unspecified: Secondary | ICD-10-CM | POA: Insufficient documentation

## 2019-01-04 DIAGNOSIS — M1990 Unspecified osteoarthritis, unspecified site: Secondary | ICD-10-CM | POA: Insufficient documentation

## 2019-01-04 DIAGNOSIS — Z1211 Encounter for screening for malignant neoplasm of colon: Secondary | ICD-10-CM | POA: Diagnosis not present

## 2019-01-04 DIAGNOSIS — Z8719 Personal history of other diseases of the digestive system: Secondary | ICD-10-CM | POA: Diagnosis not present

## 2019-01-04 DIAGNOSIS — Z7982 Long term (current) use of aspirin: Secondary | ICD-10-CM | POA: Diagnosis not present

## 2019-01-04 DIAGNOSIS — K5730 Diverticulosis of large intestine without perforation or abscess without bleeding: Secondary | ICD-10-CM | POA: Insufficient documentation

## 2019-01-04 DIAGNOSIS — G2581 Restless legs syndrome: Secondary | ICD-10-CM | POA: Diagnosis not present

## 2019-01-04 DIAGNOSIS — Z79899 Other long term (current) drug therapy: Secondary | ICD-10-CM | POA: Diagnosis not present

## 2019-01-04 HISTORY — PX: COLONOSCOPY WITH PROPOFOL: SHX5780

## 2019-01-04 HISTORY — PX: ESOPHAGOGASTRODUODENOSCOPY (EGD) WITH PROPOFOL: SHX5813

## 2019-01-04 SURGERY — COLONOSCOPY WITH PROPOFOL
Anesthesia: General

## 2019-01-04 MED ORDER — PROPOFOL 500 MG/50ML IV EMUL
INTRAVENOUS | Status: DC | PRN
  Administered 2019-01-04: 150 ug/kg/min via INTRAVENOUS

## 2019-01-04 MED ORDER — PROPOFOL 500 MG/50ML IV EMUL
INTRAVENOUS | Status: AC
  Filled 2019-01-04: qty 50

## 2019-01-04 MED ORDER — PROPOFOL 10 MG/ML IV BOLUS
INTRAVENOUS | Status: DC | PRN
  Administered 2019-01-04: 80 mg via INTRAVENOUS

## 2019-01-04 MED ORDER — SODIUM CHLORIDE 0.9 % IV SOLN
INTRAVENOUS | Status: DC
  Administered 2019-01-04: 10:00:00 via INTRAVENOUS

## 2019-01-04 MED ORDER — LIDOCAINE HCL (CARDIAC) PF 100 MG/5ML IV SOSY
PREFILLED_SYRINGE | INTRAVENOUS | Status: DC | PRN
  Administered 2019-01-04: 40 mg via INTRAVENOUS

## 2019-01-04 MED ORDER — EPHEDRINE SULFATE 50 MG/ML IJ SOLN
INTRAMUSCULAR | Status: DC | PRN
  Administered 2019-01-04: 10 mg via INTRAVENOUS

## 2019-01-04 NOTE — Op Note (Signed)
[facility] Gastroenterology Patient Name: Heather Phelps Procedure Date: 01/04/2019 9:40 AM MRN: [id number] Account #: [id number] Date of Birth: 04/22/1950 Admit Type: Outpatient Age: 68 Room: [facility] ENDO ROOM 3 Gender: Female Note Status: Finalized Procedure:             Colonoscopy Indications:           High risk colon cancer surveillance: Personal history                         of colonic polyps Providers:             Earline Mayotte, MD Medicines:             Monitored Anesthesia Care Complications:         No immediate complications. Procedure:             Pre-Anesthesia Assessment:                        - Prior to the procedure, a History and Physical was                         performed, and patient medications, allergies and                         sensitivities were reviewed. The patient's tolerance                         of previous anesthesia was reviewed.                        - The risks and benefits of the procedure and the                         sedation options and risks were discussed with the                         patient. All questions were answered and informed                         consent was obtained.                        After obtaining informed consent, the colonoscope was                         passed under direct vision. Throughout the procedure,                         the patient's blood pressure, pulse, and oxygen                         saturations were monitored continuously. The was                         introduced through the anus and advanced to the the                         cecum, identified by appendiceal orifice and ileocecal  valve. The colonoscopy was performed without                         difficulty. The patient tolerated the procedure well.                         The quality of the bowel preparation was excellent. Findings:      A few medium-mouthed diverticula were found in  the sigmoid colon.      The retroflexed view of the distal rectum and anal verge was normal and       showed no anal or rectal abnormalities. Impression:            - Diverticulosis in the sigmoid colon.                        - The distal rectum and anal verge are normal on                         retroflexion view.                        - No specimens collected. Recommendation:        - Repeat colonoscopy in 5 years for surveillance. Procedure Code(s):     --- Professional ---                        [phone], Colonoscopy, flexible; diagnostic, including                         collection of specimen(s) by brushing or washing, when                         performed (separate procedure) Diagnosis Code(s):     --- Professional ---                        Z86.010, Personal history of colonic polyps                        K57.30, Diverticulosis of large intestine without                         perforation or abscess without bleeding CPT copyright 2019 American [employer] Association. All rights reserved. The codes documented in this report are preliminary and upon coder review may  be revised to meet current compliance requirements. Robert Bellow, MD 01/04/2019 10:23:25 AM This report has been signed electronically. Number of Addenda: 0 Note Initiated On: 01/04/2019 9:40 AM Scope Withdrawal Time: 0 hours 8 minutes 58 seconds  Total Procedure Duration: 0 hours 16 minutes 51 seconds       [facility]

## 2019-01-04 NOTE — Transfer of Care (Signed)
Immediate Anesthesia Transfer of Care Note  Patient: Heather Phelps  Procedure(s) Performed: Procedure(s): COLONOSCOPY WITH PROPOFOL (N/A) ESOPHAGOGASTRODUODENOSCOPY (EGD) WITH PROPOFOL (N/A)  Patient Location: PACU and Endoscopy Unit  Anesthesia Type:General  Level of Consciousness: sedated  Airway & Oxygen Therapy: Patient Spontanous Breathing and Patient connected to nasal cannula oxygen  Post-op Assessment: Report given to RN and Post -op Vital signs reviewed and stable  Post vital signs: Reviewed and stable  Last Vitals:  Vitals:   01/04/19 0921 01/04/19 1023  BP: 120/71 100/61  Pulse: (!) 59 68  Resp: 17 17  Temp: 36.4 C (!) 36.4 C  SpO2: 62% 83%    Complications: No apparent anesthesia complications

## 2019-01-04 NOTE — Op Note (Signed)
[facility] Gastroenterology Patient Name: Heather Phelps Procedure Date: 01/04/2019 9:40 AM MRN: [id number] Account #: [id number] Date of Birth: 23-Mar-1950 Admit Type: Outpatient Age: 68 Room: [facility] ENDO ROOM 3 Gender: Female Note Status: Finalized Procedure:             Upper GI endoscopy Indications:           Epigastric abdominal pain Providers:             Earline Mayotte, MD Medicines:             Monitored Anesthesia Care Complications:         No immediate complications. Procedure:             Pre-Anesthesia Assessment:                        - Prior to the procedure, a History and Physical was                         performed, and patient medications, allergies and                         sensitivities were reviewed. The patient's tolerance                         of previous anesthesia was reviewed.                        - The risks and benefits of the procedure and the                         sedation options and risks were discussed with the                         patient. All questions were answered and informed                         consent was obtained.                        After obtaining informed consent, the endoscope was                         passed under direct vision. Throughout the procedure,                         the patient's blood pressure, pulse, and oxygen                         saturations were monitored continuously. The Endoscope                         was introduced through the mouth, and advanced to the                         second part of duodenum. The upper GI endoscopy was                         accomplished with ease. The patient tolerated the  procedure well. Findings:      The esophagus was normal.      The stomach was normal.      The examined duodenum was normal. Impression:            - Normal esophagus.                        - Normal stomach.                        - Normal  examined duodenum.                        - No specimens collected. Recommendation:        - Perform a colonoscopy today. Procedure Code(s):     --- Professional ---                        [phone], Esophagogastroduodenoscopy, flexible,                         transoral; diagnostic, including collection of                         specimen(s) by brushing or washing, when performed                         (separate procedure) Diagnosis Code(s):     --- Professional ---                        R10.13, Epigastric pain CPT copyright 2019 American Medical Association. All rights reserved. The codes documented in this report are preliminary and upon coder review may  be revised to meet current compliance requirements. Robert Bellow, MD 01/04/2019 10:00:30 AM This report has been signed electronically. Number of Addenda: 0 Note Initiated On: 01/04/2019 9:40 AM Estimated Blood Loss:  Estimated blood loss: none.      [facility]

## 2019-01-04 NOTE — H&P (Signed)
Heather Phelps [id number] 04-01-1950     HPI: 68 y/o female with past history of colonic polyps.  No present GI symptoms.  Intermittent epigastric discomfort. No significant change with omeprazol from 20 to 40 mg daily.  Presently, alternating 20/ 40 as she read the higher dose might cause other problems.   Tolerated prep as well as could be expected.   Medications Prior to Admission  Medication Sig Dispense Refill Last Dose  . acetaminophen-codeine (TYLENOL #3) 300-30 MG tablet Take by mouth.   Past Week at Unknown time  . albuterol (PROVENTIL HFA;VENTOLIN HFA) 108 (90 Base) MCG/ACT inhaler Inhale 2 puffs into the lungs every 6 (six) hours as needed for wheezing or shortness of breath.   Past Week at Unknown time  . aspirin EC 81 MG tablet Take 81 mg by mouth daily.   Past Week at Unknown time  . gabapentin (NEURONTIN) 300 MG capsule Take 300 mg by mouth at bedtime.   01/03/2019 at Unknown time  . ipratropium (ATROVENT) 0.06 % nasal spray Place 2 sprays into both nostrils 3 (three) times daily as needed for rhinitis.   01/04/2019 at Unknown time  . lovastatin (MEVACOR) 40 MG tablet Take 40 mg by mouth at bedtime.   01/03/2019 at Unknown time  . magnesium oxide (MAG-OX) 400 MG tablet Take 400 mg by mouth every morning.    01/04/2019 at Unknown time  . metoprolol succinate (TOPROL-XL) 50 MG 24 hr tablet Take by mouth.   01/04/2019 at Unknown time  . omeprazole (PRILOSEC) 20 MG capsule Take 20 mg by mouth every morning.    01/04/2019 at Unknown time  . traZODone (DESYREL) 150 MG tablet Take 75 mg by mouth at bedtime as needed for sleep.   01/03/2019 at Unknown time  . baclofen (LIORESAL) 10 MG tablet Take 0.5-1 tablets (5-10 mg total) by mouth 3 (three) times daily. (Patient not taking: Reported on 12/07/2018) 30 each 0   . irbesartan (AVAPRO) 150 MG tablet Take by mouth.     . meclizine (ANTIVERT) 25 MG tablet Take 1 tablet (25 mg total) by mouth 3 (three) times daily as needed for dizziness.  (Patient not taking: Reported on 12/07/2018) 30 tablet 0   . meloxicam (MOBIC) 15 MG tablet Take 1 tablet (15 mg total) by mouth daily as needed. (Patient not taking: Reported on 12/07/2018) 30 tablet 0    Allergies  Allergen Reactions  . Other     corticosteroids (glucocorticoids) Oral methylprednisolone caused hypertensive urgency - use with caution  . Hctz [Hydrochlorothiazide] Other (See Comments)    "severe leg cramps"  . Septra [Sulfamethoxazole-Trimethoprim] Itching  . Tetracyclines & Related Itching and Rash   Past Medical History:  Diagnosis Date  . Anxiety   . Arthritis   . Bronchitis   . Depression   . GERD (gastroesophageal reflux disease)   . Hyperlipidemia   . Hypertension   . Restless leg syndrome   . Thrombocytopenia (H[facility])    Past Surgical History:  Procedure Laterality Date  . ABDOMINAL HYSTERECTOMY    . BREAST CYST ASPIRATION Left 06/19/2001   neg  . BREAST EXCISIONAL BIOPSY Right 01/08/2001   neg  . BREAST EXCISIONAL BIOPSY Left    benign per pt  . DORSAL COMPARTMENT RELEASE Left 04/06/2016   Procedure: RELEASE DORSAL COMPARTMENT (DEQUERVAIN);  Surgeon: Donato Heinz, MD;  Location: [facility] ORS;  Service: Orthopedics;  Laterality: Left;  . HERNIA REPAIR    . OOPHORECTOMY    . VENTRAL  HERNIA REPAIR N/A 12/04/2015   Procedure: HERNIA REPAIR VENTRAL ADULT;  Surgeon: Leonie Green, MD;  Location: [facility] ORS;  Service: General;  Laterality: N/A;   Social History   Socioeconomic History  . Marital status: Legally Separated    Spouse name: Not on file  . Number of children: Not on file  . Years of education: Not on file  . Highest education level: Not on file  Occupational History  . Not on file  Social Needs  . Financial resource strain: Not on file  . Food insecurity    Worry: Not on file    Inability: Not on file  . Transportation needs    Medical: Not on file    Non-medical: Not on file  Tobacco Use  . Smoking status: Former Smoker     Packs/day: 0.25    Years: 25.00    Pack years: 6.25    Types: Cigarettes    Quit date: 02/16/2017    Years since quitting: 1.8  . Smokeless tobacco: Never Used  Substance and Sexual Activity  . Alcohol use: Yes    Comment: occassional  . Drug use: No  . Sexual activity: Not on file  Lifestyle  . Physical activity    Days per week: Not on file    Minutes per session: Not on file  . Stress: Not on file  Relationships  . Social Herbalist on phone: Not on file    Gets together: Not on file    Attends religious service: Not on file    Active member of club or organization: Not on file    Attends meetings of clubs or organizations: Not on file    Relationship status: Not on file  . Intimate partner violence    Fear of current or ex partner: Not on file    Emotionally abused: Not on file    Physically abused: Not on file    Forced sexual activity: Not on file  Other Topics Concern  . Not on file  Social History Narrative  . Not on file   Social History   Social History Narrative  . Not on file     ROS: Negative.     PE: HEENT: Negative. Lungs: Clear. Cardio: RRForest Gleason Matas Phelps 01/04/2019   Assessment:  Endoscopy exam at [facility] in 2015 showed two small adenomatous polyps in the right colon.     Plan:  Proceed with planned upper and lower endoscopy.

## 2019-01-04 NOTE — Anesthesia Procedure Notes (Signed)
Date/Time: 01/04/2019 9:49 AM Performed by: Doreen Salvage, CRNA Pre-anesthesia Checklist: Patient identified, Emergency Drugs available, Suction available and Patient being monitored Patient Re-evaluated:Patient Re-evaluated prior to induction Oxygen Delivery Method: Nasal cannula Induction Type: IV induction Dental Injury: Teeth and Oropharynx as per pre-operative assessment  Comments: Nasal cannula with etCO2 monitoring

## 2019-01-04 NOTE — Anesthesia Postprocedure Evaluation (Signed)
Anesthesia Post Note  Patient: Heather Phelps  Procedure(s) Performed: COLONOSCOPY WITH PROPOFOL (N/A ) ESOPHAGOGASTRODUODENOSCOPY (EGD) WITH PROPOFOL (N/A )  Patient location during evaluation: PACU Anesthesia Type: General Level of consciousness: awake and alert Pain management: pain level controlled Vital Signs Assessment: post-procedure vital signs reviewed and stable Respiratory status: spontaneous breathing, nonlabored ventilation, respiratory function stable and patient connected to nasal cannula oxygen Cardiovascular status: blood pressure returned to baseline and stable Postop Assessment: no apparent nausea or vomiting Anesthetic complications: no     Last Vitals:  Vitals:   01/04/19 0921 01/04/19 1023  BP: 120/71 100/61  Pulse: (!) 59 68  Resp: 17 17  Temp: 36.4 C (!) 36.4 C  SpO2: 96% 94%    Last Pain:  Vitals:   01/04/19 1023  TempSrc: Temporal  PainSc: Ferris G Adams

## 2019-01-04 NOTE — Anesthesia Post-op Follow-up Note (Signed)
Anesthesia QCDR form completed.        

## 2019-01-04 NOTE — Anesthesia Preprocedure Evaluation (Signed)
Anesthesia Evaluation  Patient identified by MRN, date of birth, ID band Patient awake    Reviewed: Allergy & Precautions, H&P , NPO status , Patient's Chart, lab work & pertinent test results, reviewed documented beta blocker date and time   Airway Mallampati: II   Neck ROM: full    Dental  (+) Poor Dentition   Pulmonary neg pulmonary ROS, Patient abstained from smoking., former smoker,    Pulmonary exam normal        Cardiovascular Exercise Tolerance: Poor hypertension, On Medications negative cardio ROS Normal cardiovascular exam Rhythm:regular Rate:Normal     Neuro/Psych PSYCHIATRIC DISORDERS Anxiety Depression negative neurological ROS     GI/Hepatic Neg liver ROS, GERD  Medicated,  Endo/Other  negative endocrine ROS  Renal/GU negative Renal ROS  negative genitourinary   Musculoskeletal   Abdominal   Peds  Hematology negative hematology ROS (+)   Anesthesia Other Findings Past Medical History: No date: Anxiety No date: Arthritis No date: Bronchitis No date: Depression No date: GERD (gastroesophageal reflux disease) No date: Hyperlipidemia No date: Hypertension No date: Restless leg syndrome No date: Thrombocytopenia ([place]) Past Surgical History: No date: ABDOMINAL HYSTERECTOMY 06/19/2001: BREAST CYST ASPIRATION; Left     Comment:  neg 01/08/2001: BREAST EXCISIONAL BIOPSY; Right     Comment:  neg No date: BREAST EXCISIONAL BIOPSY; Left     Comment:  benign per pt 04/06/2016: DORSAL COMPARTMENT RELEASE; Left     Comment:  Procedure: RELEASE DORSAL COMPARTMENT (DEQUERVAIN);                Surgeon: Dereck Leep, MD;  Location: [facility] ORS;                Service: Orthopedics;  Laterality: Left; No date: HERNIA REPAIR No date: OOPHORECTOMY 12/04/2015: VENTRAL HERNIA REPAIR; N/A     Comment:  Procedure: HERNIA REPAIR VENTRAL ADULT;  Surgeon: Leonie Green, MD;  Location: [facility] ORS;   Service: General;              Laterality: N/A; BMI    Body Mass Index: 29.26 kg/m     Reproductive/Obstetrics negative OB ROS                             Anesthesia Physical Anesthesia Plan  ASA: III  Anesthesia Plan: General   Post-op Pain Management:    Induction:   PONV Risk Score and Plan:   Airway Management Planned:   Additional Equipment:   Intra-op Plan:   Post-operative Plan:   Informed Consent: I have reviewed the patients History and Physical, chart, labs and discussed the procedure including the risks, benefits and alternatives for the proposed anesthesia with the patient or authorized representative who has indicated his/her understanding and acceptance.     Dental Advisory Given  Plan Discussed with: CRNA  Anesthesia Plan Comments:         Anesthesia Quick Evaluation

## 2019-01-07 ENCOUNTER — Encounter: Payer: Self-pay | Admitting: General Surgery

## 2019-01-27 ENCOUNTER — Other Ambulatory Visit: Payer: Self-pay | Admitting: Family Medicine

## 2019-06-07 ENCOUNTER — Other Ambulatory Visit: Payer: Self-pay | Admitting: Family Medicine

## 2019-06-07 DIAGNOSIS — Z780 Asymptomatic menopausal state: Secondary | ICD-10-CM

## 2019-06-12 ENCOUNTER — Other Ambulatory Visit: Payer: Self-pay | Admitting: Family Medicine

## 2019-06-12 DIAGNOSIS — Z1231 Encounter for screening mammogram for malignant neoplasm of breast: Secondary | ICD-10-CM

## 2019-06-20 ENCOUNTER — Ambulatory Visit
Admission: RE | Admit: 2019-06-20 | Discharge: 2019-06-20 | Disposition: A | Discharge status: Home / Self Care | Payer: Medicare Other | Source: Ambulatory Visit | Attending: Family Medicine | Admitting: Family Medicine

## 2019-06-20 DIAGNOSIS — Z780 Asymptomatic menopausal state: Secondary | ICD-10-CM

## 2019-06-20 DIAGNOSIS — Z1231 Encounter for screening mammogram for malignant neoplasm of breast: Secondary | ICD-10-CM | POA: Insufficient documentation

## 2019-11-05 ENCOUNTER — Other Ambulatory Visit: Payer: Self-pay | Admitting: Nephrology

## 2019-11-05 DIAGNOSIS — N1831 Chronic kidney disease, stage 3a: Secondary | ICD-10-CM

## 2019-11-13 ENCOUNTER — Other Ambulatory Visit: Payer: Self-pay

## 2019-11-13 ENCOUNTER — Ambulatory Visit
Admission: RE | Admit: 2019-11-13 | Discharge: 2019-11-13 | Disposition: A | Discharge status: Home / Self Care | Payer: Medicare Other | Source: Ambulatory Visit | Attending: Nephrology | Admitting: Nephrology

## 2019-11-13 DIAGNOSIS — N1831 Chronic kidney disease, stage 3a: Secondary | ICD-10-CM | POA: Diagnosis present

## 2019-12-02 ENCOUNTER — Ambulatory Visit
Admission: EM | Admit: 2019-12-02 | Discharge: 2019-12-02 | Disposition: A | Discharge status: Home / Self Care | Payer: Medicare Other | Attending: Emergency Medicine | Admitting: Emergency Medicine

## 2019-12-02 ENCOUNTER — Other Ambulatory Visit: Payer: Self-pay

## 2019-12-02 ENCOUNTER — Encounter: Payer: Self-pay | Admitting: Emergency Medicine

## 2019-12-02 DIAGNOSIS — H1032 Unspecified acute conjunctivitis, left eye: Secondary | ICD-10-CM

## 2019-12-02 MED ORDER — MOXIFLOXACIN HCL 0.5 % OP SOLN: 1.0000 [drp] | Freq: Three times a day (TID) | OPHTHALMIC | 0 refills | Status: AC

## 2019-12-02 NOTE — ED Triage Notes (Signed)
Patient c/o left eye swelling and left side face pain that started yesterday.

## 2019-12-02 NOTE — ED Provider Notes (Signed)
[facility]    CSN: [id number] Arrival date & time: 12/02/19  1406      History   Chief Complaint Chief Complaint  Patient presents with   Facial Swelling    left    HPI OCIE STANZIONE is a 69 y.o. female.   69 year old female presents for evaluation of left eye redness and swelling.  Patient reports that her symptoms started yesterday.  She is complaining of eye redness, itching to the left eye, watery discharge and she had crusty discharge on her lashes this morning.  She denies visual changes.  Her eye does feel sore and she has had a frontal headache.  She denies fever or nausea.     Past Medical History:  Diagnosis Date   Anxiety    Arthritis    Bronchitis    Depression    GERD (gastroesophageal reflux disease)    Hyperlipidemia    Hypertension    Restless leg syndrome    Thrombocytopenia (HCC)     Patient Active Problem List   Diagnosis Date Noted   Pain of left breast 12/07/2018    Past Surgical History:  Procedure Laterality Date   ABDOMINAL HYSTERECTOMY     BREAST CYST ASPIRATION Left 06/19/2001   neg   BREAST EXCISIONAL BIOPSY Right 01/08/2001   neg   BREAST EXCISIONAL BIOPSY Left    benign per pt   COLONOSCOPY WITH PROPOFOL N/A 01/04/2019   Procedure: COLONOSCOPY WITH PROPOFOL;  Surgeon: Earline Mayotte, MD;  Location: [facility];  Service: Endoscopy;  Laterality: N/A;   DORSAL COMPARTMENT RELEASE Left 04/06/2016   Procedure: RELEASE DORSAL COMPARTMENT (DEQUERVAIN);  Surgeon: Donato Heinz, MD;  Location: [facility];  Service: Orthopedics;  Laterality: Left;   ESOPHAGOGASTRODUODENOSCOPY (EGD) WITH PROPOFOL N/A 01/04/2019   Procedure: ESOPHAGOGASTRODUODENOSCOPY (EGD) WITH PROPOFOL;  Surgeon: Earline Mayotte, MD;  Location: [facility];  Service: Endoscopy;  Laterality: N/A;   HERNIA REPAIR     OOPHORECTOMY     VENTRAL HERNIA REPAIR N/A 12/04/2015   Procedure: HERNIA REPAIR VENTRAL ADULT;  Surgeon:  Nadeen Landau, MD;  Location: [facility];  Service: General;  Laterality: N/A;    OB History   No obstetric history on file.      Home Medications    Prior to Admission medications   Medication Sig Start Date End Date Taking? Authorizing Provider  aspirin EC 81 MG tablet Take 81 mg by mouth daily.   Yes [provider]  gabapentin (NEURONTIN) 300 MG capsule Take 300 mg by mouth at bedtime.   Yes [provider]  ipratropium (ATROVENT) 0.06 % nasal spray Place 2 sprays into both nostrils 3 (three) times daily as needed for rhinitis.   Yes [provider]  irbesartan (AVAPRO) 150 MG tablet Take by mouth. 05/10/17 12/02/19 Yes [provider]  lovastatin (MEVACOR) 40 MG tablet Take 40 mg by mouth at bedtime.   Yes [provider]  magnesium oxide (MAG-OX) 400 MG tablet Take 400 mg by mouth every morning.    Yes [provider]  metoprolol succinate (TOPROL-XL) 50 MG 24 hr tablet Take by mouth. 03/30/17  Yes [provider]  omeprazole (PRILOSEC) 20 MG capsule Take 20 mg by mouth every morning.    Yes [provider]  traZODone (DESYREL) 150 MG tablet Take 75 mg by mouth at bedtime as needed for sleep.   Yes [provider]  albuterol (PROVENTIL HFA;VENTOLIN HFA) 108 (90 Base) MCG/ACT inhaler Inhale 2  puffs into the lungs every 6 (six) hours as needed for wheezing or shortness of breath.    [provider]  moxifloxacin (VIGAMOX) 0.5 % ophthalmic solution Place 1 drop into the left eye 3 (three) times daily. 12/02/19   Becky Augusta, NP    Family History Family History  Problem Relation Age of Onset   Congestive Heart Failure Mother    Pancreatic cancer Father    Hypertension Father    Breast cancer Neg Hx     Social History Social History   Tobacco Use   Smoking status: Former Smoker    Packs/day: 0.25    Years: 25.00    Pack years: 6.25    Types: Cigarettes    Quit date: 02/16/2017      Years since quitting: 2.7   Smokeless tobacco: Never Used  Vaping Use   Vaping Use: Never used  Substance Use Topics   Alcohol use: Yes    Comment: occassional   Drug use: No     Allergies   Other, Hctz [hydrochlorothiazide], Septra [sulfamethoxazole-trimethoprim], and Tetracyclines & related   Review of Systems Review of Systems  Constitutional: Negative for activity change, appetite change and fever.  HENT: Negative for congestion and nosebleeds.   Eyes: Positive for discharge, redness and itching. Negative for photophobia and visual disturbance.  Respiratory: Negative for cough, shortness of breath and wheezing.   Cardiovascular: Negative for chest pain.  Gastrointestinal: Negative for vomiting.  Genitourinary: Negative for dysuria.  Musculoskeletal: Negative for arthralgias and myalgias.  Skin: Negative for rash.  Neurological: Positive for headaches. Negative for dizziness and weakness.       Frontal headache.  Hematological: Negative.   Psychiatric/Behavioral: Negative.      Physical Exam Triage Vital Signs ED Triage Vitals  Enc Vitals Group     BP 12/02/19 1514 (!) 159/87     Pulse Rate 12/02/19 1514 60     Resp 12/02/19 1514 18     Temp 12/02/19 1514 98.4 F (36.9 C)     Temp Source 12/02/19 1514 Oral     SpO2 12/02/19 1514 97 %     Weight 12/02/19 1510 160 lb (72.6 kg)     Height 12/02/19 1510 5\' 3"  (1.6 m)     Head Circumference --      Peak Flow --      Pain Score 12/02/19 1510 10     Pain Loc --      Pain Edu? --      Excl. in GC? --    No data found.  Updated Vital Signs BP (!) 159/87    Pulse 60    Temp 98.4 F (36.9 C) (Oral)    Resp 18    Ht 5\' 3"  (1.6 m)    Wt 160 lb (72.6 kg)    SpO2 97%    BMI 28.34 kg/m   Visual Acuity Right Eye Distance:   Left Eye Distance:   Bilateral Distance:    Right Eye Near:   Left Eye Near:    Bilateral Near:     Physical Exam Vitals and nursing note reviewed.  Constitutional:      General:  She is not in acute distress.    Appearance: Normal appearance. She is not toxic-appearing.  HENT:     Head: Normocephalic and atraumatic.     Nose: Nose normal. No congestion.     Mouth/Throat:     Mouth: Mucous membranes are moist.     Pharynx: Oropharynx  is clear. No oropharyngeal exudate or posterior oropharyngeal erythema.  Eyes:     General:        Right eye: No discharge.        Left eye: Discharge present.    Extraocular Movements: Extraocular movements intact.     Pupils: Pupils are equal, round, and reactive to light.     Comments: Labral and bulbar conjunctiva red and injected in the left eye.  Right eye is normal  Cardiovascular:     Rate and Rhythm: Normal rate and regular rhythm.     Pulses: Normal pulses.     Heart sounds: Normal heart sounds. No murmur heard.  No gallop.   Pulmonary:     Effort: Pulmonary effort is normal.     Breath sounds: Normal breath sounds. No wheezing or rales.  Musculoskeletal:        General: No swelling or tenderness. Normal range of motion.     Cervical back: Normal range of motion and neck supple.  Skin:    General: Skin is warm and dry.     Capillary Refill: Capillary refill takes less than 2 seconds.     Findings: No bruising, erythema or rash.  Neurological:     General: No focal deficit present.     Mental Status: She is alert and oriented to person, place, and time.  Psychiatric:        Mood and Affect: Mood normal.        Behavior: Behavior normal.        Thought Content: Thought content normal.        Judgment: Judgment normal.      UC Treatments / Results  Labs (all labs ordered are listed, but only abnormal results are displayed) Labs Reviewed - No data to display  EKG   Radiology No results found.  Procedures Procedures (including critical care time)  Medications Ordered in UC Medications - No data to display  Initial Impression / Assessment and Plan / UC Course  I have reviewed the triage vital signs  and the nursing notes.  Pertinent labs & imaging results that were available during my care of the patient were reviewed by me and considered in my medical decision making (see chart for details).   Patient presents with a red, injected left eye.  There is mild swelling to the upper eyelid.  There is no tenderness to palpation of either upper or lower eyelid or the periorbital area.  There is clear discharge from the eye and some remnants of crusty discharge on the lashes.  No photophobia or sensation of foreign body at present.  Presentation consistent with conjunctivitis.  Will treat with Vigamox 3 times daily x7 days.   Final Clinical Impressions(s) / UC Diagnoses   Final diagnoses:  Acute conjunctivitis of left eye, unspecified acute conjunctivitis type     Discharge Instructions     Use the Vigamox drops in your left eye, 1 drop 3 times a day for 7 days.  If you develop symptoms in your right eye start treating your right eye with the same directions and some length of time.  Wipe down all surfaces, countertops, and doorknobs after the first and second 24 hours on eyedrops.  If your symptoms persist follow-up with your eye doctor.    ED Prescriptions    Medication Sig Dispense Auth. Provider   moxifloxacin (VIGAMOX) 0.5 % ophthalmic solution Place 1 drop into the left eye 3 (three) times daily. 3 mL Becky Augusta,  NP     PDMP not reviewed this encounter.   Becky Augustayan, Eviana Sibilia, NP 12/02/19 1549

## 2019-12-02 NOTE — Discharge Instructions (Addendum)
Use the Vigamox drops in your left eye, 1 drop 3 times a day for 7 days.  If you develop symptoms in your right eye start treating your right eye with the same directions and some length of time.  Wipe down all surfaces, countertops, and doorknobs after the first and second 24 hours on eyedrops.  If your symptoms persist follow-up with your eye doctor.

## 2019-12-12 ENCOUNTER — Encounter: Payer: Self-pay | Admitting: *Deleted

## 2019-12-12 ENCOUNTER — Telehealth: Payer: Self-pay | Admitting: *Deleted

## 2019-12-12 DIAGNOSIS — Z87891 Personal history of nicotine dependence: Secondary | ICD-10-CM

## 2019-12-12 DIAGNOSIS — Z122 Encounter for screening for malignant neoplasm of respiratory organs: Secondary | ICD-10-CM

## 2019-12-12 NOTE — Telephone Encounter (Signed)
Received referral for initial lung cancer screening scan. Contacted patient and obtained smoking history,(former, quit 02/16/17, 27 pack year) as well as answering questions related to screening process. Patient denies signs of lung cancer such as weight loss or hemoptysis. Patient denies comorbidity that would prevent curative treatment if lung cancer were found. Patient is scheduled for shared decision making visit and CT scan on 12/30/19 at 1030am.

## 2019-12-30 ENCOUNTER — Other Ambulatory Visit: Payer: Self-pay

## 2019-12-30 ENCOUNTER — Ambulatory Visit
Admission: RE | Admit: 2019-12-30 | Discharge: 2019-12-30 | Disposition: A | Discharge status: Home / Self Care | Payer: Medicare Other | Source: Ambulatory Visit | Attending: Nurse Practitioner | Admitting: Nurse Practitioner

## 2019-12-30 ENCOUNTER — Inpatient Hospital Stay: Payer: Medicare Other | Attending: Nurse Practitioner | Admitting: Nurse Practitioner

## 2019-12-30 DIAGNOSIS — Z87891 Personal history of nicotine dependence: Secondary | ICD-10-CM

## 2019-12-30 DIAGNOSIS — Z122 Encounter for screening for malignant neoplasm of respiratory organs: Secondary | ICD-10-CM | POA: Diagnosis present

## 2019-12-30 NOTE — Progress Notes (Signed)
Virtual Visit via Video Enabled Telemedicine Note   I connected with Heather Phelps on 12/30/19 at 10:25 AM EST by video enabled telemedicine visit and verified that I am speaking with the correct person using two identifiers.   I discussed the limitations, risks, security and privacy concerns of performing an evaluation and management service by telemedicine and the availability of in-person appointments. I also discussed with the patient that there may be a patient responsible charge related to this service. The patient expressed understanding and agreed to proceed.   Other persons participating in the visit and their role in the encounter: Burgess Estelle, RN- checking in patient & navigation  Patient's location: [place]  Provider's location: Clinic  Chief Complaint: Low Dose CT Screening  Patient agreed to evaluation by telemedicine to discuss shared decision making for consideration of low dose CT lung cancer screening.    In accordance with CMS guidelines, patient has met eligibility criteria including age, absence of signs or symptoms of lung cancer.  Social History   Tobacco Use  . Smoking status: Former Smoker    Packs/day: 0.75    Years: 36.00    Pack years: 27.00    Types: Cigarettes    Quit date: 02/16/2017    Years since quitting: 2.8  . Smokeless tobacco: Never Used  Substance Use Topics  . Alcohol use: Yes    Comment: occassional     A shared decision-making session was conducted prior to the performance of CT scan. This includes one or more decision aids, includes benefits and harms of screening, follow-up diagnostic testing, over-diagnosis, false positive rate, and total radiation exposure.   Counseling on the importance of adherence to annual lung cancer LDCT screening, impact of co-morbidities, and ability or willingness to undergo diagnosis and treatment is imperative for compliance of the program.   Counseling on the importance of continued smoking  cessation for former smokers; the importance of smoking cessation for current smokers, and information about tobacco cessation interventions have been given to patient including [facility] and [employer] [place] programs.   Written order for lung cancer screening with LDCT has been given to the patient and any and all questions have been answered to the best of my abilities.    Yearly follow up will be coordinated by Burgess Estelle, Thoracic Navigator.  I discussed the assessment and treatment plan with the patient. The patient was provided an opportunity to ask questions and all were answered. The patient agreed with the plan and demonstrated an understanding of the instructions.   The patient was advised to call back or seek an in-person evaluation if the symptoms worsen or if the condition fails to improve as anticipated.   I provided 15 minutes of face-to-face video visit time during this encounter, and > 50% was spent counseling as documented under my assessment & plan.   Beckey Rutter, DNP, AGNP-C [place] [facility] [phone] (clinic)

## 2020-01-01 ENCOUNTER — Encounter: Payer: Self-pay | Admitting: *Deleted

## 2020-11-17 ENCOUNTER — Other Ambulatory Visit: Payer: Self-pay | Admitting: Family Medicine

## 2020-11-18 ENCOUNTER — Other Ambulatory Visit: Payer: Self-pay | Admitting: Family Medicine

## 2020-11-18 DIAGNOSIS — N644 Mastodynia: Secondary | ICD-10-CM

## 2020-11-18 DIAGNOSIS — Z1231 Encounter for screening mammogram for malignant neoplasm of breast: Secondary | ICD-10-CM

## 2020-11-18 DIAGNOSIS — R928 Other abnormal and inconclusive findings on diagnostic imaging of breast: Secondary | ICD-10-CM

## 2020-11-18 IMAGING — MG MM DIGITAL DIAGNOSTIC UNILAT*L* W/ TOMO W/ CAD
4 series · 4 of 12 positions shown · non-contrast
Comparison: Previous exam(s).

CLINICAL DATA: Patient presents with diffuse pain throughout the
superior left breast.

EXAM:
DIGITAL DIAGNOSTIC UNILATERAL LEFT MAMMOGRAM WITH CAD AND TOMO

[L MLO synth-2D]
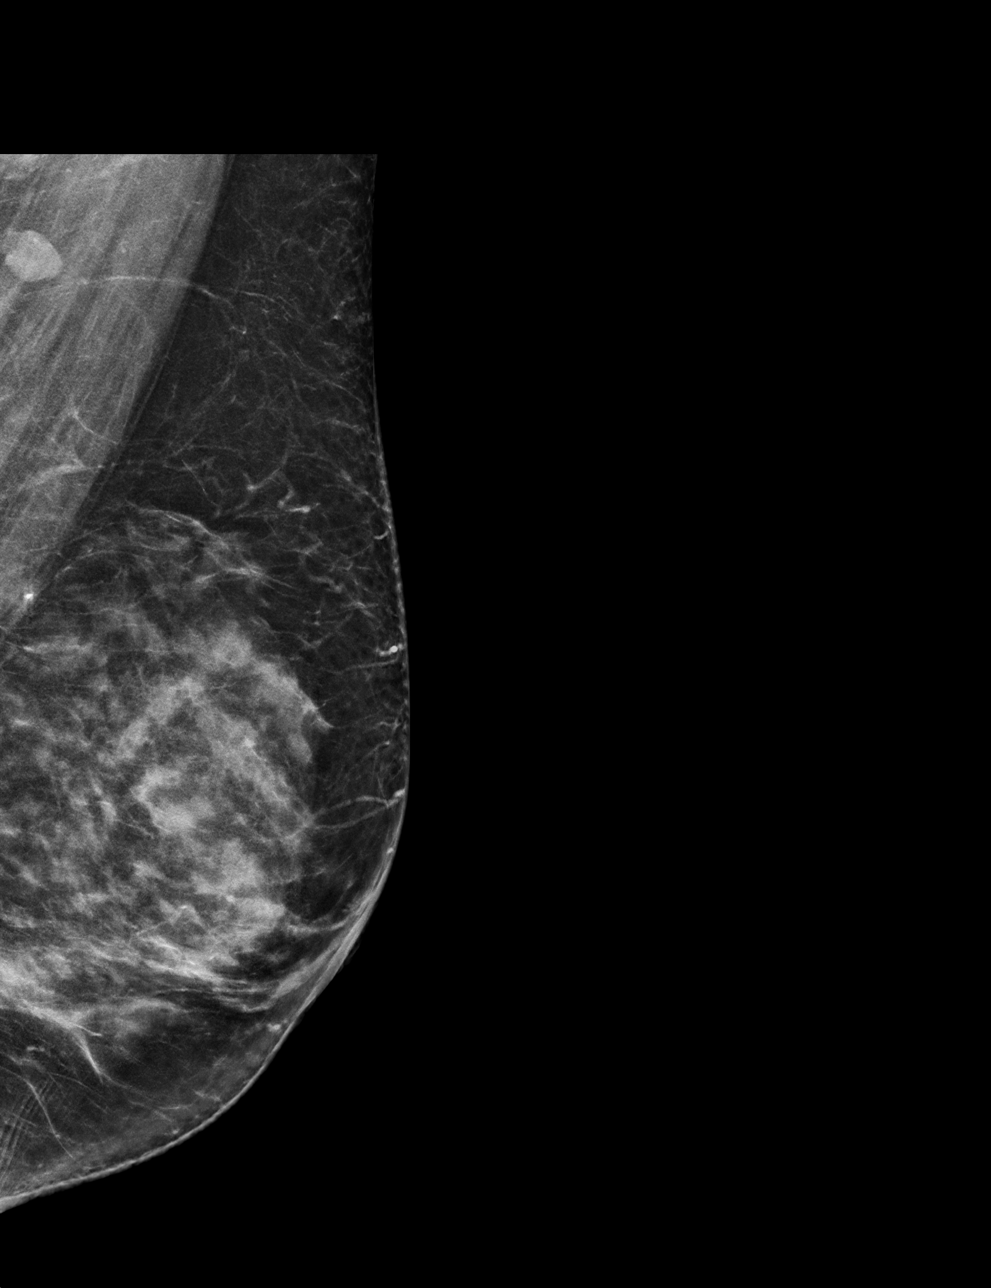

[L CC synth-2D]
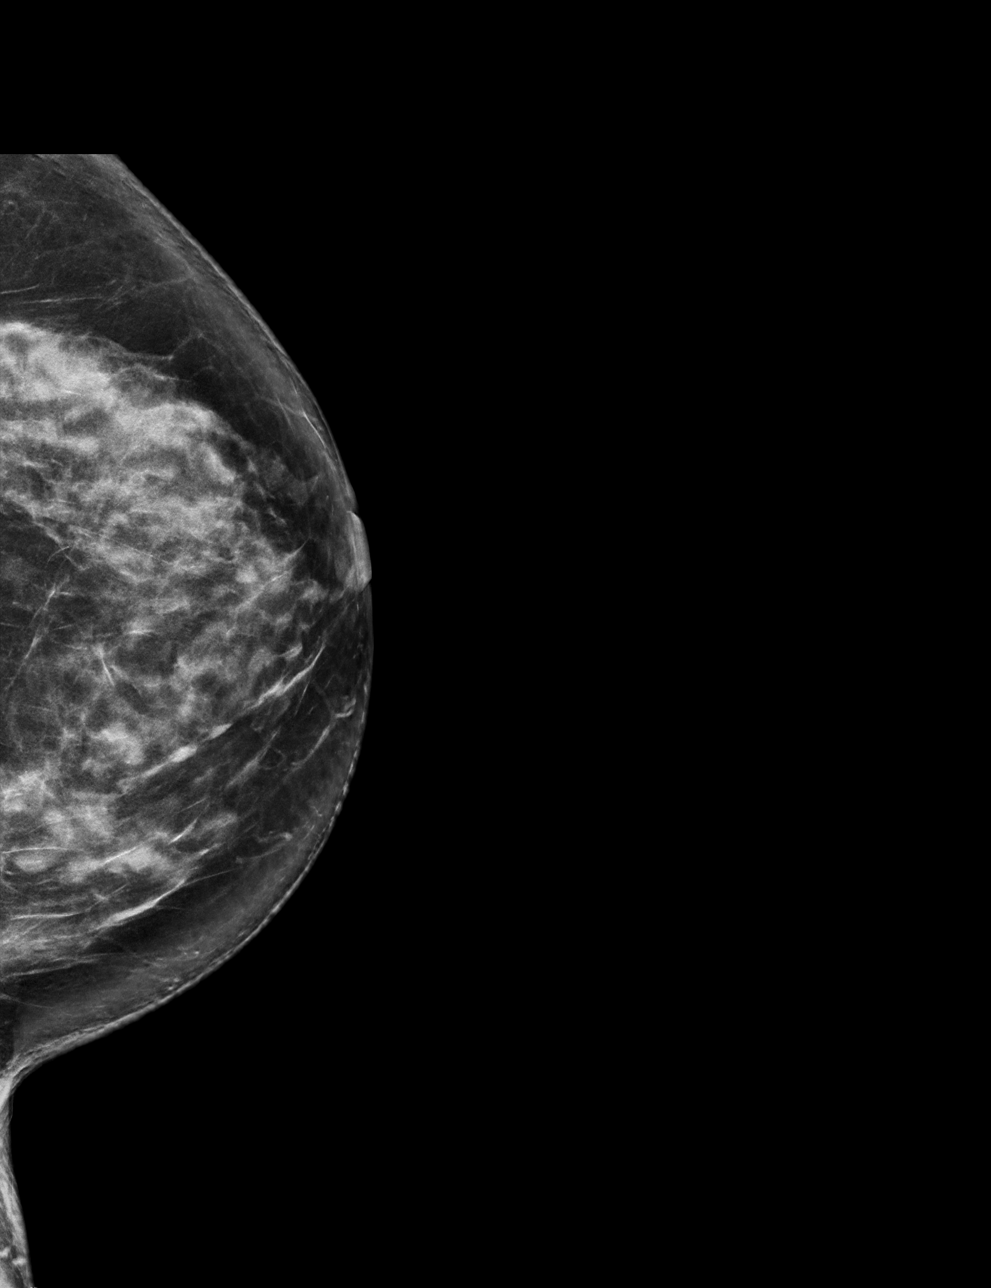

[L CC tomo · tomo slice 37/73.0]
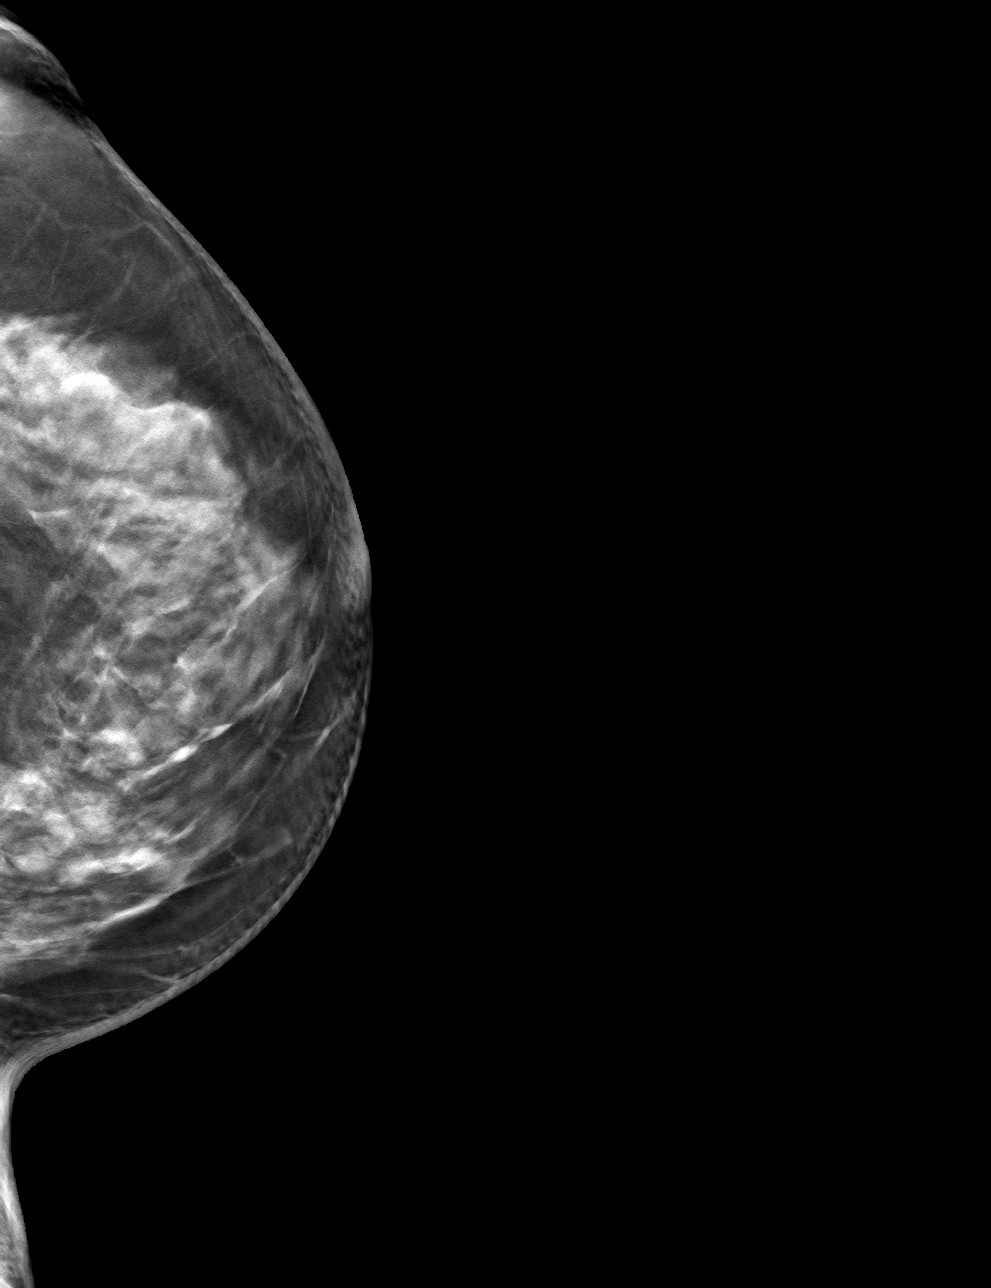

[L MLO tomo · tomo slice 35/68.0]
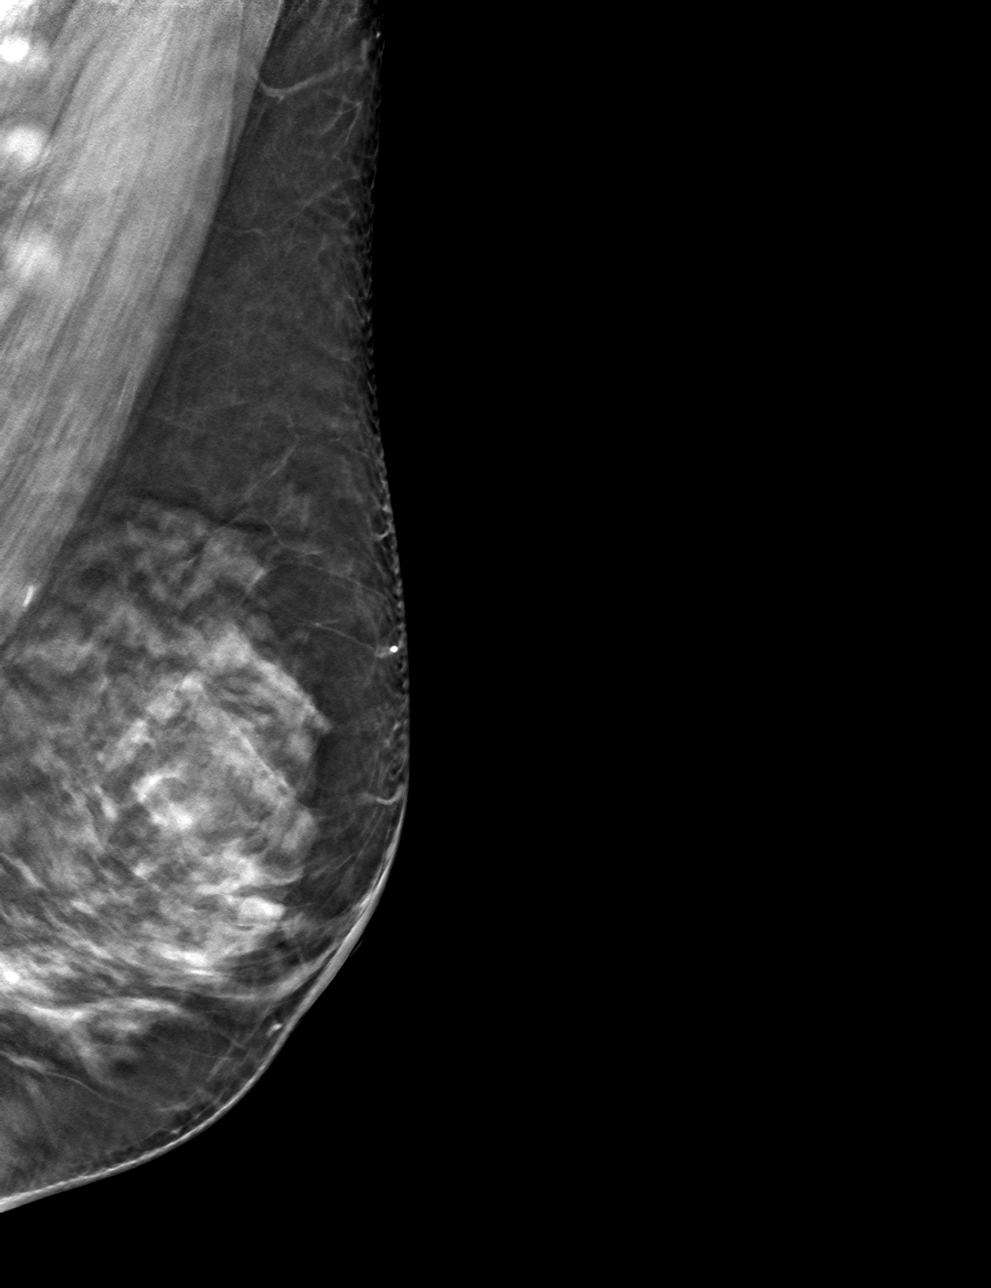

[4 of 12 positions shown; findings below may reference images not displayed]

ACR Breast Density Category c: The breast tissue is heterogeneously
dense, which may obscure small masses.
FINDINGS: No suspicious mass, distortion, or microcalcifications are
identified to suggest presence of malignancy. There are no findings
in the superior left breast to explain the patient's pain.

Mammographic images were processed with CAD.
IMPRESSION: No mammographic evidence of malignancy in the left breast or other
finding to explain the patient's diffuse breast pain.

RECOMMENDATION:
Return to annual screening mammography which will be due in February 2019.

We discussed some of the issues regarding breast pain, including
limiting caffeine, wearing adequate support, over-the-counter pain
medication, low-fat diet, exercise, and ice as needed.

I have discussed the findings and recommendations with the patient.
If applicable, a reminder letter will be sent to the patient
regarding the next appointment.

BI-RADS CATEGORY  1: Negative.

## 2020-11-27 ENCOUNTER — Other Ambulatory Visit: Payer: Self-pay

## 2020-11-27 ENCOUNTER — Ambulatory Visit
Admission: RE | Admit: 2020-11-27 | Discharge: 2020-11-27 | Disposition: A | Discharge status: Home / Self Care | Payer: Medicare Other | Source: Ambulatory Visit | Attending: Family Medicine | Admitting: Family Medicine

## 2020-11-27 DIAGNOSIS — N644 Mastodynia: Secondary | ICD-10-CM

## 2021-04-22 ENCOUNTER — Telehealth: Payer: Self-pay | Admitting: *Deleted

## 2021-04-22 ENCOUNTER — Other Ambulatory Visit: Payer: Self-pay | Admitting: Family Medicine

## 2021-04-22 DIAGNOSIS — Z780 Asymptomatic menopausal state: Secondary | ICD-10-CM

## 2021-04-22 DIAGNOSIS — Z1231 Encounter for screening mammogram for malignant neoplasm of breast: Secondary | ICD-10-CM

## 2021-04-22 NOTE — Telephone Encounter (Signed)
Left message for pt to call to schedule f/u lung screening CT scan.  °

## 2021-05-13 IMAGING — MG DIGITAL SCREENING BILAT W/ TOMO W/ CAD
8 series · 9 of 24 positions shown · non-contrast
Comparison: Previous exam(s).

CLINICAL DATA: Screening.

EXAM:
DIGITAL SCREENING BILATERAL MAMMOGRAM WITH TOMO AND CAD

[R MLO synth-2D]
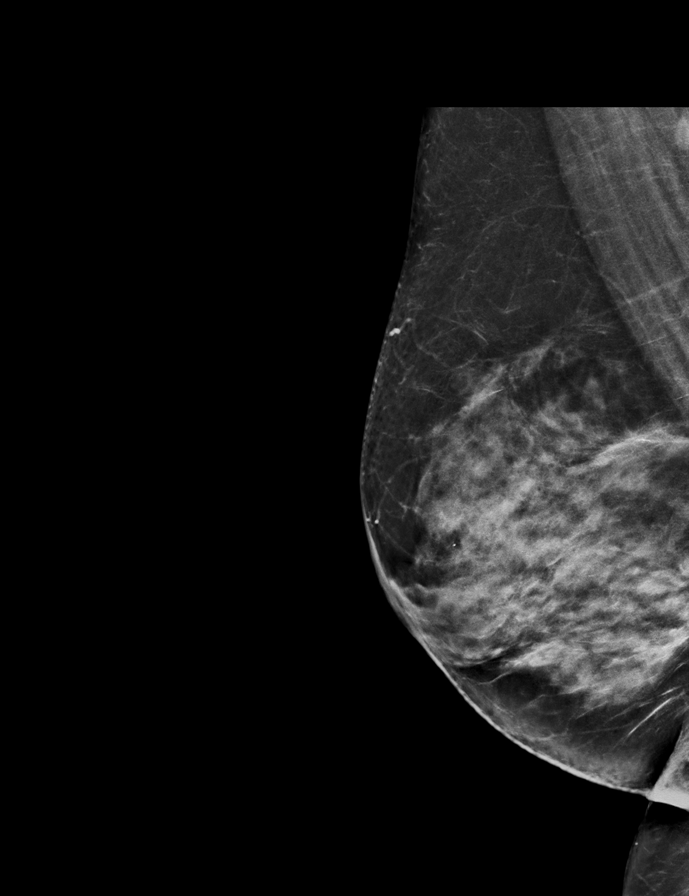

[L MLO synth-2D]
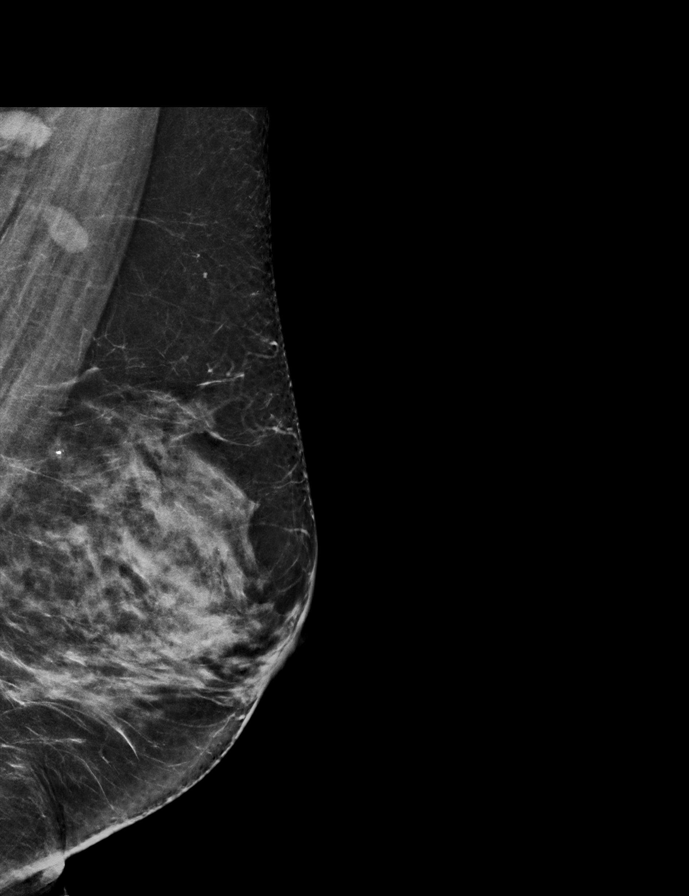

[R CC synth-2D]
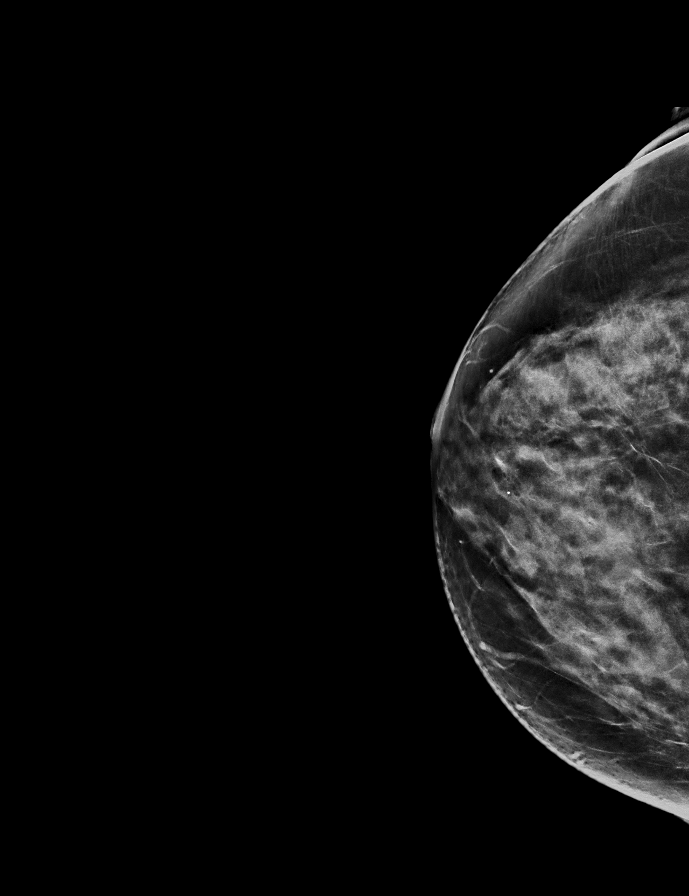

[L CC synth-2D]
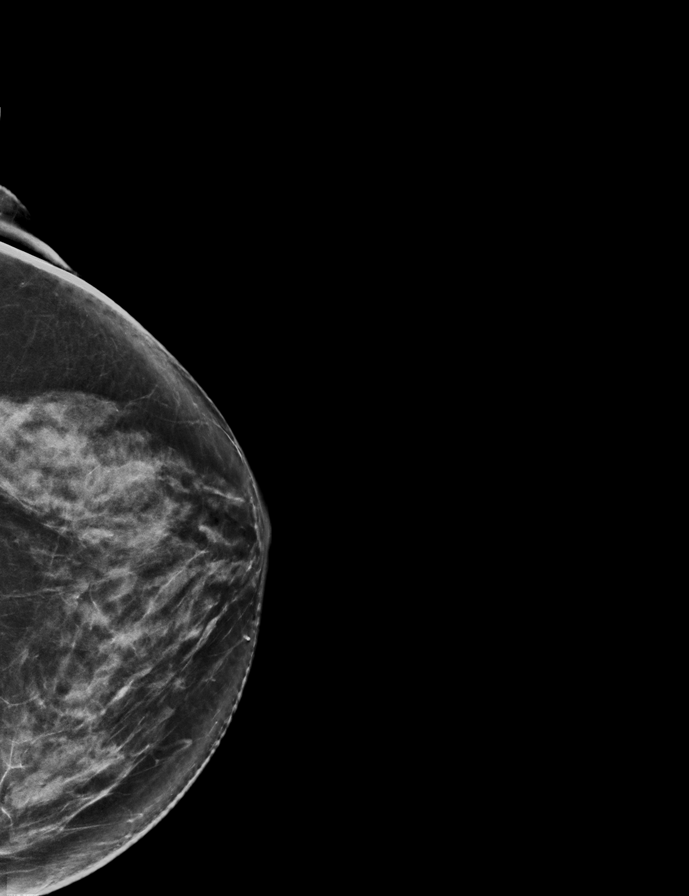

[L CC tomo · 2 of 73 frames shown]
[frame 24/73]
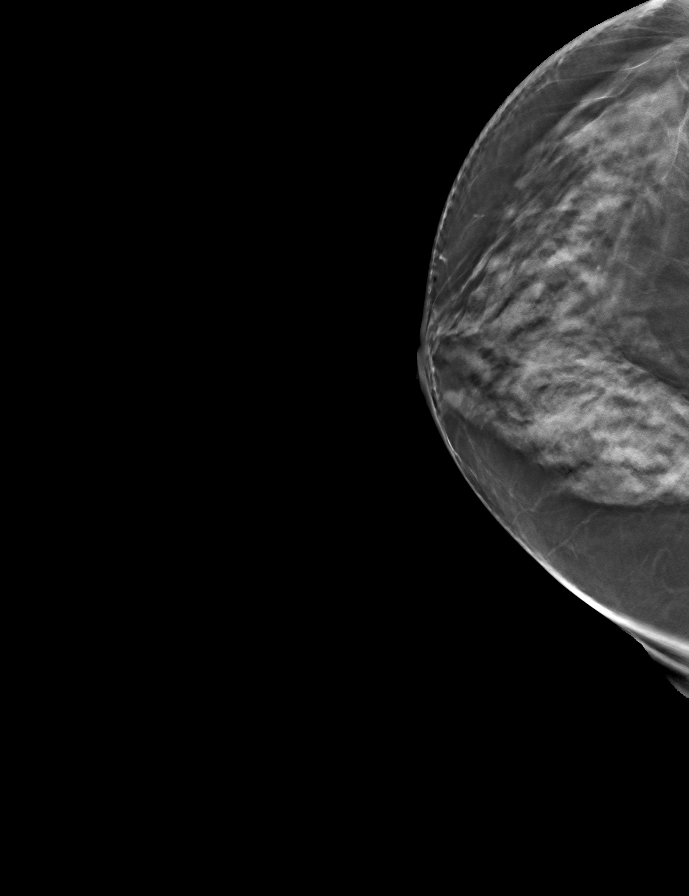
[frame 37/73]
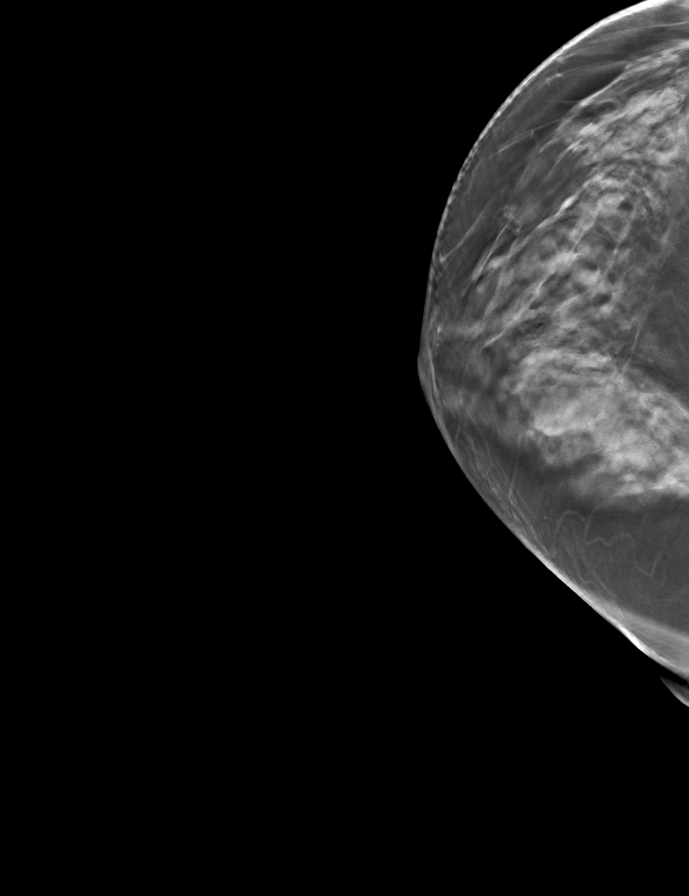

[L MLO tomo · tomo slice 37/72.0]
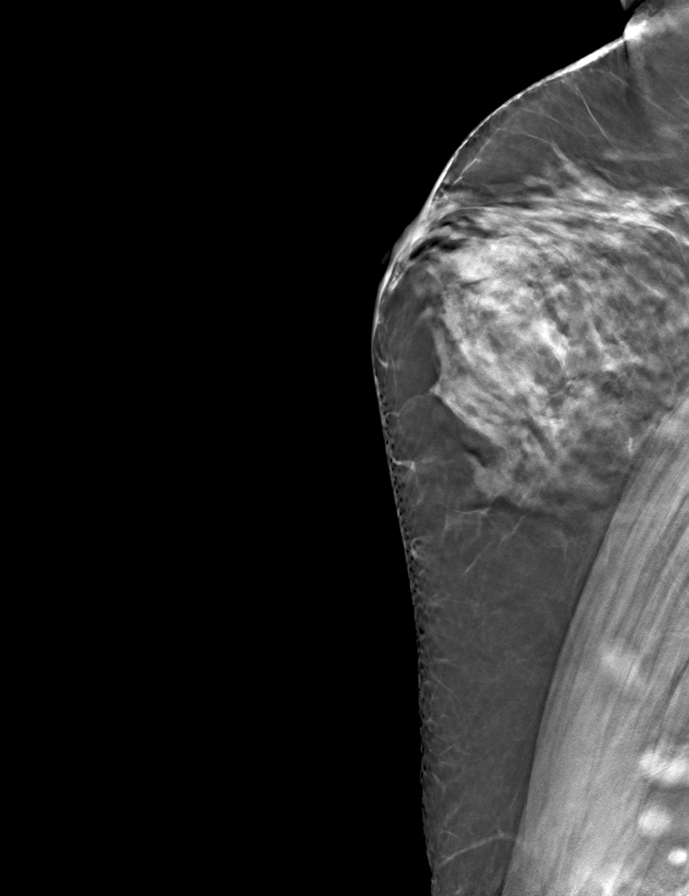

[R CC tomo · tomo slice 35/70.0]
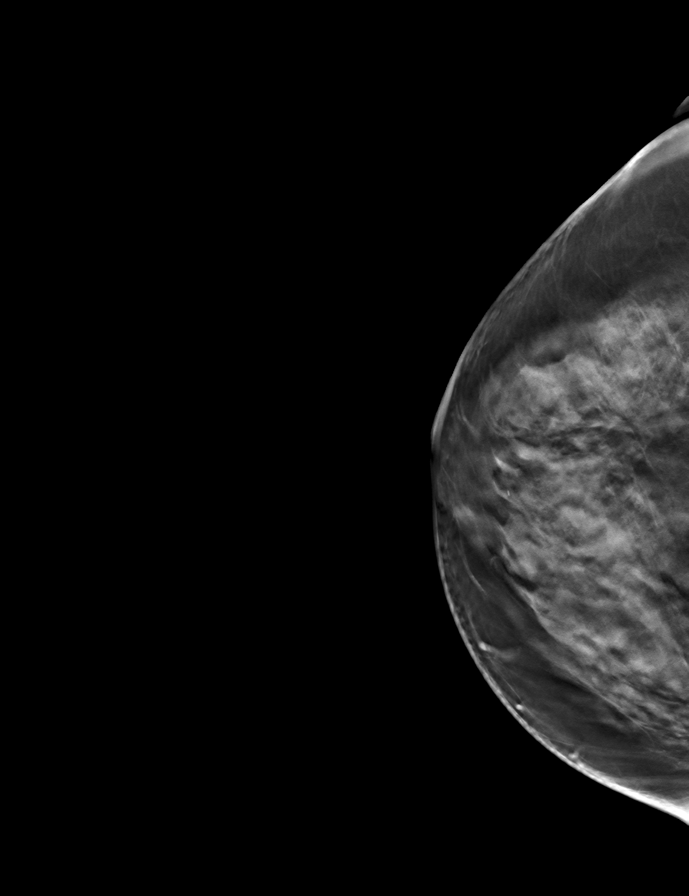

[R MLO tomo · tomo slice 37/72.0]
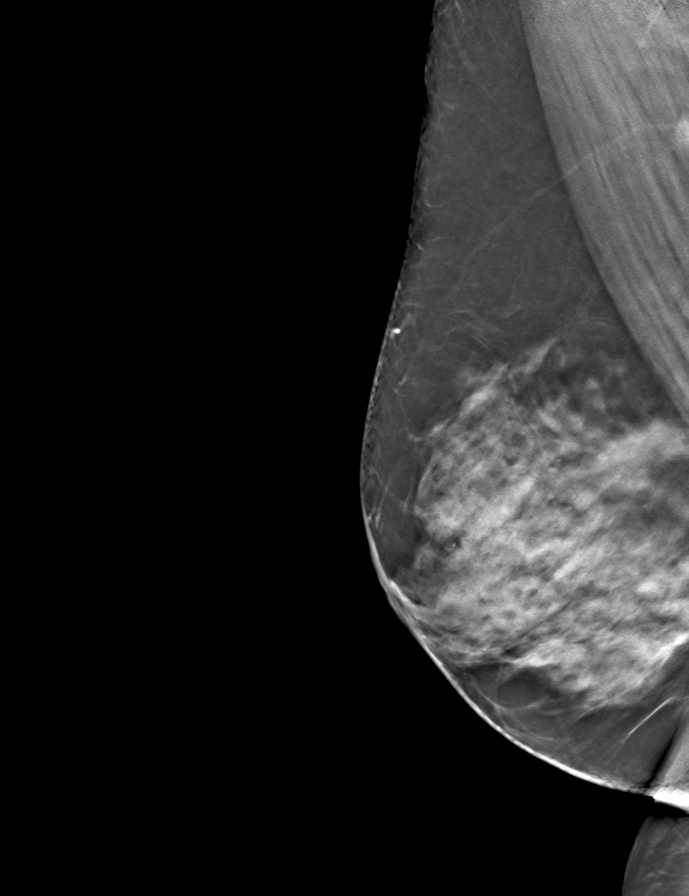

[9 of 24 positions shown; findings below may reference images not displayed]

ACR Breast Density Category d: The breast tissue is extremely dense,
which lowers the sensitivity of mammography
FINDINGS: There are no findings suspicious for malignancy. Images were
processed with CAD.
IMPRESSION: No mammographic evidence of malignancy. A result letter of this
screening mammogram will be mailed directly to the patient.

RECOMMENDATION:
Screening mammogram in one year. (Code:[id number])

BI-RADS CATEGORY  1: Negative.

## 2021-05-14 ENCOUNTER — Other Ambulatory Visit: Payer: Self-pay | Admitting: Family Medicine

## 2021-05-14 DIAGNOSIS — F17210 Nicotine dependence, cigarettes, uncomplicated: Secondary | ICD-10-CM

## 2021-05-14 DIAGNOSIS — F17200 Nicotine dependence, unspecified, uncomplicated: Secondary | ICD-10-CM

## 2021-05-27 ENCOUNTER — Ambulatory Visit
Admission: RE | Admit: 2021-05-27 | Discharge: 2021-05-27 | Disposition: A | Discharge status: Home / Self Care | Payer: Medicare Other | Source: Ambulatory Visit | Attending: Family Medicine | Admitting: Family Medicine

## 2021-05-27 DIAGNOSIS — F17210 Nicotine dependence, cigarettes, uncomplicated: Secondary | ICD-10-CM | POA: Insufficient documentation

## 2021-05-27 DIAGNOSIS — F17200 Nicotine dependence, unspecified, uncomplicated: Secondary | ICD-10-CM | POA: Diagnosis present

## 2021-05-29 ENCOUNTER — Telehealth: Payer: Self-pay | Admitting: Emergency Medicine

## 2021-05-29 ENCOUNTER — Other Ambulatory Visit: Payer: Self-pay

## 2021-05-29 ENCOUNTER — Encounter: Payer: Self-pay | Admitting: Emergency Medicine

## 2021-05-29 ENCOUNTER — Ambulatory Visit
Admission: EM | Admit: 2021-05-29 | Discharge: 2021-05-29 | Disposition: A | Discharge status: Home / Self Care | Payer: Medicare Other | Attending: Physician Assistant | Admitting: Physician Assistant

## 2021-05-29 DIAGNOSIS — R519 Headache, unspecified: Secondary | ICD-10-CM

## 2021-05-29 DIAGNOSIS — Z20822 Contact with and (suspected) exposure to covid-19: Secondary | ICD-10-CM | POA: Diagnosis not present

## 2021-05-29 DIAGNOSIS — B349 Viral infection, unspecified: Secondary | ICD-10-CM | POA: Diagnosis present

## 2021-05-29 DIAGNOSIS — R5383 Other fatigue: Secondary | ICD-10-CM | POA: Diagnosis present

## 2021-05-29 DIAGNOSIS — R6883 Chills (without fever): Secondary | ICD-10-CM | POA: Diagnosis present

## 2021-05-29 LAB — RESP PANEL BY RT-PCR (FLU A&B, COVID) ARPGX2
Influenza A by PCR: NEGATIVE
Influenza B by PCR: NEGATIVE
SARS Coronavirus 2 by RT PCR: NEGATIVE

## 2021-05-29 NOTE — Discharge Instructions (Addendum)
-  We are testing for COVID and flu.  We will call you with the results when they come in.  As discussed it takes about an hour or a little longer.  Depending on those results we may send antiviral medicine for you. ?- Your symptoms are consistent with a virus.  I do not suspect you have pneumonia since you do not have a productive cough and your lungs are clear and no fever on today's exam.  However, if you do develop those symptoms you should be reexamined. ?- Increase rest and fluid intake and consider over-the-counter Tylenol as needed for any fever and aches. ?

## 2021-05-29 NOTE — Telephone Encounter (Signed)
Tried to call pt, LMTCB. ? ? ?Per Athena Masse, PA let patient know she is negative for covid and flu. Probably another virus. Rest and fluids. Come back or go to ER for any worsening or new symptoms.  ? ? ? ?

## 2021-05-29 NOTE — ED Triage Notes (Signed)
Pt c/o chills, left ear pain, headache. Started about 4 days ago. She states she has sweats intermittently also. She states she does not have nasal congestion, runny nose or other URI symptoms. She states she felt like this when she had pneumonia in the past.  ?

## 2021-05-29 NOTE — ED Provider Notes (Signed)
?[place] ? ? ? ?CSN: [id number] ?Arrival date & time: 05/29/21  1328 ? ? ?  ? ?History   ?Chief Complaint ?Chief Complaint  ?Patient presents with  ? Chills  ? ? ?HPI ?Heather Phelps is a 71 y.o. female presenting for 3-day history of chills, sweats, fatigue and headaches.  Also reports a mild ache in the left ear at times.  She denies any recorded fevers but has felt feverish.  Denies cough, congestion, sore throat, chest pain, breathing difficulty or wheezing, vomiting or diarrhea.  Patient says she feels like she did when she had pneumonia years ago.  She says she feels like she has flulike symptoms.  Denies any concern for COVID-19.  She has not taken any medication for symptoms.  Denies any sick contacts.  Medical history significant for hypertension, hyperlipidemia, anxiety, restless leg syndrome, GERD. ? ?HPI ? ?Past Medical History:  ?Diagnosis Date  ? Anxiety   ? Arthritis   ? Bronchitis   ? Depression   ? GERD (gastroesophageal reflux disease)   ? Hyperlipidemia   ? Hypertension   ? Restless leg syndrome   ? Thrombocytopenia ([place])   ? ? ?Patient Active Problem List  ? Diagnosis Date Noted  ? Pain of left breast 12/07/2018  ? ? ?Past Surgical History:  ?Procedure Laterality Date  ? ABDOMINAL HYSTERECTOMY    ? BREAST CYST ASPIRATION Left 06/19/2001  ? neg  ? BREAST EXCISIONAL BIOPSY Right 01/08/2001  ? neg  ? BREAST EXCISIONAL BIOPSY Left   ? benign per pt  ? COLONOSCOPY WITH PROPOFOL N/A 01/04/2019  ? Procedure: COLONOSCOPY WITH PROPOFOL;  Surgeon: Robert Bellow, MD;  Location: [facility] ENDOSCOPY;  Service: Endoscopy;  Laterality: N/A;  ? DORSAL COMPARTMENT RELEASE Left 04/06/2016  ? Procedure: RELEASE DORSAL COMPARTMENT (DEQUERVAIN);  Surgeon: Dereck Leep, MD;  Location: [facility] ORS;  Service: Orthopedics;  Laterality: Left;  ? ESOPHAGOGASTRODUODENOSCOPY (EGD) WITH PROPOFOL N/A 01/04/2019  ? Procedure: ESOPHAGOGASTRODUODENOSCOPY (EGD) WITH PROPOFOL;  Surgeon: Robert Bellow, MD;   Location: [facility] ENDOSCOPY;  Service: Endoscopy;  Laterality: N/A;  ? HERNIA REPAIR    ? OOPHORECTOMY    ? VENTRAL HERNIA REPAIR N/A 12/04/2015  ? Procedure: HERNIA REPAIR VENTRAL ADULT;  Surgeon: Leonie Green, MD;  Location: [facility] ORS;  Service: General;  Laterality: N/A;  ? ? ?OB History   ?No obstetric history on file. ?  ? ? ? ?Home Medications   ? ?Prior to Admission medications   ?Medication Sig Start Date End Date Taking? Authorizing Provider  ?albuterol (PROVENTIL HFA;VENTOLIN HFA) 108 (90 Base) MCG/ACT inhaler Inhale 2 puffs into the lungs every 6 (six) hours as needed for wheezing or shortness of breath.   Yes [provider]  ?amLODipine (NORVASC) 5 MG tablet Take by mouth. 10/15/15 11/12/21 Yes [provider]  ?aspirin EC 81 MG tablet Take 81 mg by mouth daily.   Yes [provider]  ?atorvastatin (LIPITOR) 40 MG tablet Take by mouth. 11/12/20 11/12/21 Yes [provider]  ?DULoxetine (CYMBALTA) 20 MG capsule Take by mouth. 09/25/20 09/25/21 Yes [provider]  ?gabapentin (NEURONTIN) 300 MG capsule Take 300 mg by mouth at bedtime.   Yes [provider]  ?ipratropium (ATROVENT) 0.06 % nasal spray Place 2 sprays into both nostrils 3 (three) times daily as needed for rhinitis.   Yes [provider]  ?irbesartan (AVAPRO) 150 MG tablet Take by mouth. 05/10/17 05/29/21 Yes [provider]  ?magnesium oxide (MAG-OX) 400 MG  tablet Take 400 mg by mouth every morning.    Yes [provider]  ?metoprolol succinate (TOPROL-XL) 50 MG 24 hr tablet Take by mouth. 03/30/17  Yes [provider]  ?omeprazole (PRILOSEC) 20 MG capsule Take 20 mg by mouth every morning.    Yes [provider]  ?traZODone (DESYREL) 150 MG tablet Take 75 mg by mouth at bedtime as needed for sleep.   Yes [provider]  ?lovastatin (MEVACOR) 40 MG tablet Take 40 mg by mouth at bedtime.    [provider]  ?moxifloxacin (VIGAMOX)  0.5 % ophthalmic solution Place 1 drop into the left eye 3 (three) times daily. 12/02/19   Margarette Canada, NP  ? ? ?Family History ?Family History  ?Problem Relation Age of Onset  ? Congestive Heart Failure Mother   ? Pancreatic cancer Father   ? Hypertension Father   ? Breast cancer Neg Hx   ? ? ?Social History ?Social History  ? ?Tobacco Use  ? Smoking status: Former  ?  Packs/day: 0.75  ?  Years: 36.00  ?  Pack years: 27.00  ?  Types: Cigarettes  ?  Quit date: 02/16/2017  ?  Years since quitting: 4.2  ? Smokeless tobacco: Never  ?Vaping Use  ? Vaping Use: Never used  ?Substance Use Topics  ? Alcohol use: Yes  ?  Comment: occassional  ? Drug use: No  ? ? ? ?Allergies   ?Other, Hctz [hydrochlorothiazide], Septra [sulfamethoxazole-trimethoprim], and Tetracyclines & related ? ? ?Review of Systems ?Review of Systems  ?Constitutional:  Positive for chills, diaphoresis and fatigue. Negative for fever.  ?HENT:  Positive for ear pain. Negative for congestion, rhinorrhea, sinus pressure, sinus pain and sore throat.   ?Respiratory:  Negative for cough and shortness of breath.   ?Gastrointestinal:  Negative for abdominal pain, nausea and vomiting.  ?Musculoskeletal:  Positive for myalgias. Negative for arthralgias.  ?Skin:  Negative for rash.  ?Neurological:  Positive for headaches. Negative for dizziness and weakness.  ?Hematological:  Negative for adenopathy.  ? ? ?Physical Exam ?Triage Vital Signs ?ED Triage Vitals  ?Enc Vitals Group  ?   BP   ?   Pulse   ?   Resp   ?   Temp   ?   Temp src   ?   SpO2   ?   Weight   ?   Height   ?   Head Circumference   ?   Peak Flow   ?   Pain Score   ?   Pain Loc   ?   Pain Edu?   ?   Excl. in [place]?   ? ?No data found. ? ?Updated Vital Signs ?BP 138/78 (BP Location: Left Arm)   Pulse 60   Temp 99.1 ?F (37.3 ?C) (Oral)   Resp 18   Ht 5\' 3"  (1.6 m)   Wt 160 lb 0.9 oz (72.6 kg)   SpO2 95%   BMI 28.35 kg/m?  ?   ? ?Physical Exam ?Vitals and nursing note reviewed.  ?Constitutional:   ?    General: She is not in acute distress. ?   Appearance: Normal appearance. She is not ill-appearing or toxic-appearing.  ?HENT:  ?   Head: Normocephalic and atraumatic.  ?   Right Ear: Tympanic membrane, ear canal and external ear normal.  ?   Left Ear: Tympanic membrane, ear canal and external ear normal.  ?   Nose: Nose normal.  ?  Mouth/Throat:  ?   Mouth: Mucous membranes are moist.  ?   Pharynx: Oropharynx is clear.  ?Eyes:  ?   General: No scleral icterus.    ?   Right eye: No discharge.     ?   Left eye: No discharge.  ?   Conjunctiva/sclera: Conjunctivae normal.  ?Cardiovascular:  ?   Rate and Rhythm: Normal rate and regular rhythm.  ?   Heart sounds: Normal heart sounds.  ?Pulmonary:  ?   Effort: Pulmonary effort is normal. No respiratory distress.  ?   Breath sounds: Normal breath sounds.  ?Musculoskeletal:  ?   Cervical back: Neck supple.  ?Skin: ?   General: Skin is dry.  ?Neurological:  ?   General: No focal deficit present.  ?   Mental Status: She is alert. Mental status is at baseline.  ?   Motor: No weakness.  ?   Gait: Gait normal.  ?Psychiatric:     ?   Mood and Affect: Mood normal.     ?   Behavior: Behavior normal.     ?   Thought Content: Thought content normal.  ? ? ? ?UC Treatments / Results  ?Labs ?(all labs ordered are listed, but only abnormal results are displayed) ?Labs Reviewed  ?RESP PANEL BY RT-PCR (FLU A&B, COVID) [id number]  ? ? ?EKG ? ? ?Radiology ?No results found. ? ?Procedures ?Procedures (including critical care time) ? ?Medications Ordered in UC ?Medications - No data to display ? ?Initial Impression / Assessment and Plan / UC Course  ?I have reviewed the triage vital signs and the nursing notes. ? ?Pertinent labs & imaging results that were available during my care of the patient were reviewed by me and considered in my medical decision making (see chart for details). ? ?71 year old female presenting for chills, body aches, headaches, fatigue and ear pain/pressure at times.  Has  felt feverish but not recorded temperature.  Vitals all normal and stable patient overall well-appearing.  Exam significant for well-appearing female.  No evidence of otitis media or other upper respiratory type infec

## 2021-06-22 ENCOUNTER — Ambulatory Visit
Admission: RE | Admit: 2021-06-22 | Discharge: 2021-06-22 | Disposition: A | Discharge status: Home / Self Care | Payer: Medicare Other | Source: Ambulatory Visit | Attending: Family Medicine | Admitting: Family Medicine

## 2021-06-22 DIAGNOSIS — Z780 Asymptomatic menopausal state: Secondary | ICD-10-CM | POA: Diagnosis present

## 2021-11-12 ENCOUNTER — Other Ambulatory Visit: Payer: Self-pay | Admitting: *Deleted

## 2021-11-12 DIAGNOSIS — Z122 Encounter for screening for malignant neoplasm of respiratory organs: Secondary | ICD-10-CM

## 2021-11-12 DIAGNOSIS — Z87891 Personal history of nicotine dependence: Secondary | ICD-10-CM

## 2021-11-29 ENCOUNTER — Ambulatory Visit
Admission: RE | Admit: 2021-11-29 | Discharge: 2021-11-29 | Disposition: A | Discharge status: Home / Self Care | Payer: Medicare Other | Source: Ambulatory Visit | Attending: Family Medicine | Admitting: Family Medicine

## 2021-11-29 DIAGNOSIS — Z1231 Encounter for screening mammogram for malignant neoplasm of breast: Secondary | ICD-10-CM | POA: Insufficient documentation

## 2022-03-18 ENCOUNTER — Encounter: Payer: Self-pay | Admitting: Emergency Medicine

## 2022-03-18 ENCOUNTER — Ambulatory Visit
Admission: EM | Admit: 2022-03-18 | Discharge: 2022-03-18 | Disposition: A | Discharge status: Other Acute Inpatient Hospital | Payer: Medicare Other | Attending: Nurse Practitioner | Admitting: Nurse Practitioner

## 2022-03-18 DIAGNOSIS — R531 Weakness: Secondary | ICD-10-CM | POA: Diagnosis not present

## 2022-03-18 DIAGNOSIS — R509 Fever, unspecified: Secondary | ICD-10-CM

## 2022-03-18 DIAGNOSIS — R7981 Abnormal blood-gas level: Secondary | ICD-10-CM

## 2022-03-18 DIAGNOSIS — R519 Headache, unspecified: Secondary | ICD-10-CM | POA: Diagnosis not present

## 2022-03-18 NOTE — ED Notes (Signed)
Patient is being discharged from the Urgent Care and sent to the [facility] Emergency Department via EMS . Per Rosario Adie, NP, patient is in need of higher level of care due to hypoxia and fever. Patient and family members are aware and verbalizes understanding of plan of care.  Vitals:   03/18/22 1319 03/18/22 1338  BP: 96/66   Pulse: 80 70  Resp: 16 20  Temp: 100.2 F (37.9 C)   SpO2: (!) 86% 92%

## 2022-03-18 NOTE — Discharge Instructions (Addendum)
Pt taken to ER via EMS

## 2022-03-18 NOTE — ED Provider Notes (Signed)
[facility]    CSN: [id number] Arrival date & time: 03/18/22  1306      History   Chief Complaint Chief Complaint  Patient presents with   Cough   Headache    HPI Heather Phelps is a 72 y.o. female presents for evaluation of cough, weakness, headache.  Patient reports 2 days of cough with congestion, sore throat, ear fullness, diaphoresis.  She was seen by her PCP yesterday for the symptoms and was diagnosed with a viral upper respiratory infection.  COVID flu and RSV testing is pending.  She was prescribed cough medicine and nasal spray.  Patient contacted her PCP office this morning stating that she was having the worst headache of her life as well as feeling off balance and weak.  She was instructed to go to urgent care or emergency room.  She does endorse worsening of her life today 10 out of 10 states she feels shaky and unable to stand.  Denies history of headaches or migraines.  She does have a history of COPD.  She is vaccinated for flu and COVID.  On intake her oxygen was 86% on room air.  Per her PCP note yesterday it was 95% on room air.  She denies any dizziness, nausea/vomiting/diarrhea, or syncope.  Denies hospitalization for COPD in the past year.  She is with a neighbor who states "she is not right".   Cough Associated symptoms: fever, headaches and sore throat   Headache Associated symptoms: congestion, cough, fatigue, fever, sore throat and weakness     Past Medical History:  Diagnosis Date   Anxiety    Arthritis    Bronchitis    Depression    GERD (gastroesophageal reflux disease)    Hyperlipidemia    Hypertension    Restless leg syndrome    Thrombocytopenia ([place])     Patient Active Problem List   Diagnosis Date Noted   Pain of left breast 12/07/2018    Past Surgical History:  Procedure Laterality Date   ABDOMINAL HYSTERECTOMY     BREAST CYST ASPIRATION Left 06/19/2001   neg   BREAST EXCISIONAL BIOPSY Right 01/08/2001   neg   BREAST  EXCISIONAL BIOPSY Left    benign per pt   COLONOSCOPY WITH PROPOFOL N/A 01/04/2019   Procedure: COLONOSCOPY WITH PROPOFOL;  Surgeon: Robert Bellow, MD;  Location: [facility] ENDOSCOPY;  Service: Endoscopy;  Laterality: N/A;   DORSAL COMPARTMENT RELEASE Left 04/06/2016   Procedure: RELEASE DORSAL COMPARTMENT (DEQUERVAIN);  Surgeon: Dereck Leep, MD;  Location: [facility] ORS;  Service: Orthopedics;  Laterality: Left;   ESOPHAGOGASTRODUODENOSCOPY (EGD) WITH PROPOFOL N/A 01/04/2019   Procedure: ESOPHAGOGASTRODUODENOSCOPY (EGD) WITH PROPOFOL;  Surgeon: Robert Bellow, MD;  Location: [facility] ENDOSCOPY;  Service: Endoscopy;  Laterality: N/A;   HERNIA REPAIR     OOPHORECTOMY     VENTRAL HERNIA REPAIR N/A 12/04/2015   Procedure: HERNIA REPAIR VENTRAL ADULT;  Surgeon: Leonie Green, MD;  Location: [facility] ORS;  Service: General;  Laterality: N/A;    OB History   No obstetric history on file.      Home Medications    Prior to Admission medications   Medication Sig Start Date End Date Taking? Authorizing Provider  albuterol (PROVENTIL HFA;VENTOLIN HFA) 108 (90 Base) MCG/ACT inhaler Inhale 2 puffs into the lungs every 6 (six) hours as needed for wheezing or shortness of breath.    [provider]  aspirin EC 81 MG tablet Take 81 mg by mouth daily.  [provider]  atorvastatin (LIPITOR) 40 MG tablet Take by mouth. 11/12/20 11/12/21  [provider]  DULoxetine (CYMBALTA) 20 MG capsule Take by mouth. 09/25/20 09/25/21  [provider]  gabapentin (NEURONTIN) 300 MG capsule Take 300 mg by mouth at bedtime.    [provider]  ipratropium (ATROVENT) 0.06 % nasal spray Place 2 sprays into both nostrils 3 (three) times daily as needed for rhinitis.    [provider]  irbesartan (AVAPRO) 150 MG tablet Take by mouth. 05/10/17 05/29/21  [provider]  lovastatin (MEVACOR) 40 MG tablet Take 40 mg by mouth at bedtime.    [provider]   magnesium oxide (MAG-OX) 400 MG tablet Take 400 mg by mouth every morning.     [provider]  metoprolol succinate (TOPROL-XL) 50 MG 24 hr tablet Take by mouth. 03/30/17   [provider]  moxifloxacin (VIGAMOX) 0.5 % ophthalmic solution Place 1 drop into the left eye 3 (three) times daily. 12/02/19   Margarette Canada, NP  omeprazole (PRILOSEC) 20 MG capsule Take 20 mg by mouth every morning.     [provider]  traZODone (DESYREL) 150 MG tablet Take 75 mg by mouth at bedtime as needed for sleep.    [provider]    Family History Family History  Problem Relation Age of Onset   Congestive Heart Failure Mother    Pancreatic cancer Father    Hypertension Father    Breast cancer Neg Hx     Social History Social History   Tobacco Use   Smoking status: Former    Packs/day: 0.75    Years: 36.00    Total pack years: 27.00    Types: Cigarettes    Quit date: 02/16/2017    Years since quitting: 5.0   Smokeless tobacco: Never  Vaping Use   Vaping Use: Never used  Substance Use Topics   Alcohol use: Yes    Comment: occassional   Drug use: No     Allergies   Other, Hctz [hydrochlorothiazide], Septra [sulfamethoxazole-trimethoprim], and Tetracyclines & related   Review of Systems Review of Systems  Constitutional:  Positive for fatigue and fever.  HENT:  Positive for congestion and sore throat.   Respiratory:  Positive for cough.   Neurological:  Positive for weakness and headaches.     Physical Exam Triage Vital Signs ED Triage Vitals  Enc Vitals Group     BP 03/18/22 1319 96/66     Pulse Rate 03/18/22 1319 80     Resp 03/18/22 1319 16     Temp 03/18/22 1319 100.2 F (37.9 C)     Temp Source 03/18/22 1319 Oral     SpO2 03/18/22 1319 (!) 86 %     Weight 03/18/22 1318 160 lb 0.9 oz (72.6 kg)     Height 03/18/22 1318 5\' 3"  (1.6 m)     Head Circumference --      Peak Flow --      Pain Score 03/18/22 1318 10     Pain Loc --       Pain Edu? --      Excl. in [place]? --    No data found.  Updated Vital Signs BP 96/66 (BP Location: Right Arm)   Pulse 80   Temp 100.2 F (37.9 C) (Oral)   Resp 16   Ht 5\' 3"  (1.6 m)   Wt 160 lb 0.9 oz (72.6 kg)   SpO2 (!) 86%   BMI  28.35 kg/m   Visual Acuity Right Eye Distance:   Left Eye Distance:   Bilateral Distance:    Right Eye Near:   Left Eye Near:    Bilateral Near:     Physical Exam Vitals and nursing note reviewed.  Constitutional:      General: She is not in acute distress.    Appearance: She is ill-appearing. She is not toxic-appearing or diaphoretic.  HENT:     Head: Normocephalic and atraumatic.     Mouth/Throat:     Mouth: Mucous membranes are moist.     Pharynx: Posterior oropharyngeal erythema present.  Eyes:     Extraocular Movements: Extraocular movements intact.     Conjunctiva/sclera: Conjunctivae normal.     Pupils: Pupils are equal, round, and reactive to light.  Cardiovascular:     Rate and Rhythm: Normal rate and regular rhythm.     Heart sounds: Normal heart sounds.  Pulmonary:     Effort: Pulmonary effort is normal. No respiratory distress.     Breath sounds: Normal breath sounds. No stridor. No wheezing, rhonchi or rales.  Skin:    General: Skin is warm and dry.  Neurological:     General: No focal deficit present.     Mental Status: She is alert and oriented to person, place, and time.     GCS: GCS eye subscore is 4. GCS verbal subscore is 5. GCS motor subscore is 6.     Cranial Nerves: No facial asymmetry.     Comments: Strength 5/5 BUE. Neg FAST exam   Psychiatric:        Mood and Affect: Mood normal.        Behavior: Behavior normal.      UC Treatments / Results  Labs (all labs ordered are listed, but only abnormal results are displayed) Labs Reviewed - No data to display  EKG   Radiology No results found.  Procedures Procedures (including critical care time)  Medications Ordered in UC Medications - No data to  display  Initial Impression / Assessment and Plan / UC Course  I have reviewed the triage vital signs and the nursing notes.  Pertinent labs & imaging results that were available during my care of the patient were reviewed by me and considered in my medical decision making (see chart for details).     I reviewed exam and symptoms with patient.  O2 did go to 92% on 3L oxygen. Discussed limitations and abilities of urgent care.  Given reported worsening of her life, with hypoxia, fever, and weakness, advise she go to the emergency room for further evaluation and treatment.  Patient is reluctant but is in agreement to go via EMS to the ER.  Patient was taken via EMS to emergency room. Final Clinical Impressions(s) / UC Diagnoses   Final diagnoses:  None   Discharge Instructions   None    ED Prescriptions   None    PDMP not reviewed this encounter.   Melynda Ripple, NP 03/18/22 1350

## 2022-03-18 NOTE — ED Triage Notes (Signed)
Patient c/o cough and congestion that started yesterday morning.  Patient reports headache, ear fullness, sweats and feeling off balance that started this morning.  Patient was seen by her PCP yesterday.  Patient was given nasal spray and cough medicine.

## 2022-05-30 ENCOUNTER — Ambulatory Visit: Payer: Medicare Other

## 2022-06-08 ENCOUNTER — Ambulatory Visit
Admission: RE | Admit: 2022-06-08 | Discharge: 2022-06-08 | Disposition: A | Discharge status: Home / Self Care | Payer: Medicare Other | Source: Ambulatory Visit | Attending: Acute Care | Admitting: Acute Care

## 2022-06-08 DIAGNOSIS — Z87891 Personal history of nicotine dependence: Secondary | ICD-10-CM | POA: Diagnosis present

## 2022-06-08 DIAGNOSIS — Z122 Encounter for screening for malignant neoplasm of respiratory organs: Secondary | ICD-10-CM | POA: Insufficient documentation

## 2022-06-13 ENCOUNTER — Other Ambulatory Visit: Payer: Self-pay | Admitting: Acute Care

## 2022-06-13 DIAGNOSIS — Z87891 Personal history of nicotine dependence: Secondary | ICD-10-CM

## 2022-06-13 DIAGNOSIS — Z122 Encounter for screening for malignant neoplasm of respiratory organs: Secondary | ICD-10-CM

## 2022-11-06 ENCOUNTER — Ambulatory Visit
Admission: EM | Admit: 2022-11-06 | Discharge: 2022-11-06 | Disposition: A | Discharge status: Home / Self Care | Payer: Medicare Other | Attending: Emergency Medicine | Admitting: Emergency Medicine

## 2022-11-06 DIAGNOSIS — J028 Acute pharyngitis due to other specified organisms: Secondary | ICD-10-CM | POA: Insufficient documentation

## 2022-11-06 DIAGNOSIS — J029 Acute pharyngitis, unspecified: Secondary | ICD-10-CM | POA: Insufficient documentation

## 2022-11-06 DIAGNOSIS — B9789 Other viral agents as the cause of diseases classified elsewhere: Secondary | ICD-10-CM | POA: Diagnosis not present

## 2022-11-06 DIAGNOSIS — Z1152 Encounter for screening for COVID-19: Secondary | ICD-10-CM | POA: Insufficient documentation

## 2022-11-06 LAB — SARS CORONAVIRUS 2 BY RT PCR: SARS Coronavirus 2 by RT PCR: NEGATIVE

## 2022-11-06 NOTE — Discharge Instructions (Addendum)
Your COVID test today was negative.  I do believe you have a viral infection which is causing your symptoms.  Gargle with warm salt water 2-3 times a day to soothe your throat, aid in pain relief, and aid in healing.  Take over-the-counter Tylenol and/or ibuprofen according to the package instructions as needed for pain.  You can also use Chloraseptic or Sucrets lozenges, 1 lozenge every 2 hours as needed for throat pain.  If you develop any new or worsening symptoms return for reevaluation.

## 2022-11-06 NOTE — ED Provider Notes (Signed)
[facility]    CSN: [id number] Arrival date & time: 11/06/22  1406      History   Chief Complaint Chief Complaint  Patient presents with   Cough   Sore Throat    HPI Heather Phelps is a 72 y.o. female.   HPI  72 year old female with a past medical history significant for restless leg syndrome, hyperlipidemia, anxiety, GERD, arthritis, hypertension, and thrombocytopenia presents for evaluation of headache, sore throat, and bodyaches that began yesterday.  She is also had an infrequent nonproductive cough.  She denies any fever, runny nose, nasal congestion, shortness breath, or wheezing.  No known sick contacts.  Past Medical History:  Diagnosis Date   Anxiety    Arthritis    Bronchitis    Depression    GERD (gastroesophageal reflux disease)    Hyperlipidemia    Hypertension    Restless leg syndrome    Thrombocytopenia (HCC)     Patient Active Problem List   Diagnosis Date Noted   Pain of left breast 12/07/2018    Past Surgical History:  Procedure Laterality Date   ABDOMINAL HYSTERECTOMY     BREAST CYST ASPIRATION Left 06/19/2001   neg   BREAST EXCISIONAL BIOPSY Right 01/08/2001   neg   BREAST EXCISIONAL BIOPSY Left    benign per pt   COLONOSCOPY WITH PROPOFOL N/A 01/04/2019   Procedure: COLONOSCOPY WITH PROPOFOL;  Surgeon: Earline Mayotte, MD;  Location: [facility];  Service: Endoscopy;  Laterality: N/A;   DORSAL COMPARTMENT RELEASE Left 04/06/2016   Procedure: RELEASE DORSAL COMPARTMENT (DEQUERVAIN);  Surgeon: Donato Heinz, MD;  Location: [facility];  Service: Orthopedics;  Laterality: Left;   ESOPHAGOGASTRODUODENOSCOPY (EGD) WITH PROPOFOL N/A 01/04/2019   Procedure: ESOPHAGOGASTRODUODENOSCOPY (EGD) WITH PROPOFOL;  Surgeon: Earline Mayotte, MD;  Location: [facility];  Service: Endoscopy;  Laterality: N/A;   HERNIA REPAIR     OOPHORECTOMY     VENTRAL HERNIA REPAIR N/A 12/04/2015   Procedure: HERNIA REPAIR VENTRAL ADULT;  Surgeon:  Nadeen Landau, MD;  Location: [facility];  Service: General;  Laterality: N/A;    OB History   No obstetric history on file.      Home Medications    Prior to Admission medications   Medication Sig Start Date End Date Taking? Authorizing Provider  albuterol (PROVENTIL HFA;VENTOLIN HFA) 108 (90 Base) MCG/ACT inhaler Inhale 2 puffs into the lungs every 6 (six) hours as needed for wheezing or shortness of breath.    [provider]  aspirin EC 81 MG tablet Take 81 mg by mouth daily.    [provider]  atorvastatin (LIPITOR) 40 MG tablet Take by mouth. 11/12/20 11/12/21  [provider]  DULoxetine (CYMBALTA) 20 MG capsule Take by mouth. 09/25/20 09/25/21  [provider]  gabapentin (NEURONTIN) 300 MG capsule Take 300 mg by mouth at bedtime.    [provider]  ipratropium (ATROVENT) 0.06 % nasal spray Place 2 sprays into both nostrils 3 (three) times daily as needed for rhinitis.    [provider]  irbesartan (AVAPRO) 150 MG tablet Take by mouth. 05/10/17 05/29/21  [provider]  lovastatin (MEVACOR) 40 MG tablet Take 40 mg by mouth at bedtime.    [provider]  magnesium oxide (MAG-OX) 400 MG tablet Take 400 mg by mouth every morning.     [provider]  metoprolol succinate (TOPROL-XL) 50 MG 24 hr tablet Take by mouth. 03/30/17   [provider]  moxifloxacin (  VIGAMOX) 0.5 % ophthalmic solution Place 1 drop into the left eye 3 (three) times daily. 12/02/19   Becky Augusta, NP  omeprazole (PRILOSEC) 20 MG capsule Take 20 mg by mouth every morning.     [provider]  traZODone (DESYREL) 150 MG tablet Take 75 mg by mouth at bedtime as needed for sleep.    [provider]    Family History Family History  Problem Relation Age of Onset   Congestive Heart Failure Mother    Pancreatic cancer Father    Hypertension Father    Breast cancer Neg Hx     Social History Social  History   Tobacco Use   Smoking status: Former    Current packs/day: 0.00    Average packs/day: 0.8 packs/day for 36.0 years (27.0 ttl pk-yrs)    Types: Cigarettes    Start date: 02/16/1981    Quit date: 02/16/2017    Years since quitting: 5.7   Smokeless tobacco: Never  Vaping Use   Vaping status: Never Used  Substance Use Topics   Alcohol use: Yes    Comment: occassional   Drug use: No     Allergies   Other, Hctz [hydrochlorothiazide], Septra [sulfamethoxazole-trimethoprim], and Tetracyclines & related   Review of Systems Review of Systems  Constitutional:  Positive for chills. Negative for fever.  HENT:  Positive for sore throat. Negative for congestion, ear pain and rhinorrhea.   Respiratory:  Positive for cough. Negative for shortness of breath and wheezing.   Musculoskeletal:  Positive for arthralgias and myalgias.  Neurological:  Positive for headaches.     Physical Exam Triage Vital Signs ED Triage Vitals  Encounter Vitals Group     BP 11/06/22 1435 127/67     Systolic BP Percentile --      Diastolic BP Percentile --      Pulse Rate 11/06/22 1435 78     Resp 11/06/22 1435 20     Temp 11/06/22 1435 98.7 F (37.1 C)     Temp Source 11/06/22 1435 Oral     SpO2 11/06/22 1435 90 %     Weight 11/06/22 1434 158 lb (71.7 kg)     Height --      Head Circumference --      Peak Flow --      Pain Score 11/06/22 1433 10     Pain Loc --      Pain Education --      Exclude from Growth Chart --    No data found.  Updated Vital Signs BP 127/67 (BP Location: Left Arm)   Pulse 78   Temp 98.7 F (37.1 C) (Oral)   Resp 20   Wt 158 lb (71.7 kg)   SpO2 90% Comment: copd  BMI 27.99 kg/m   Visual Acuity Right Eye Distance:   Left Eye Distance:   Bilateral Distance:    Right Eye Near:   Left Eye Near:    Bilateral Near:     Physical Exam Vitals and nursing note reviewed.  Constitutional:      Appearance: Normal appearance. She is not ill-appearing.  HENT:      Head: Normocephalic and atraumatic.     Mouth/Throat:     Mouth: Mucous membranes are moist.     Pharynx: Oropharynx is clear. Posterior oropharyngeal erythema present. No oropharyngeal exudate.     Comments: Tonsillar pillars are unremarkable.  Posterior pharynx demonstrates erythema and injection with clear postnasal drip. Cardiovascular:     Rate  and Rhythm: Normal rate and regular rhythm.     Pulses: Normal pulses.     Heart sounds: Normal heart sounds. No murmur heard.    No friction rub. No gallop.  Pulmonary:     Effort: Pulmonary effort is normal.     Breath sounds: Normal breath sounds. No wheezing, rhonchi or rales.  Musculoskeletal:     Cervical back: Normal range of motion and neck supple. No tenderness.  Lymphadenopathy:     Cervical: No cervical adenopathy.  Skin:    General: Skin is warm and dry.     Capillary Refill: Capillary refill takes less than 2 seconds.     Findings: No erythema or rash.  Neurological:     General: No focal deficit present.     Mental Status: She is alert and oriented to person, place, and time.      UC Treatments / Results  Labs (all labs ordered are listed, but only abnormal results are displayed) Labs Reviewed  SARS CORONAVIRUS 2 BY RT PCR    EKG   Radiology No results found.  Procedures Procedures (including critical care time)  Medications Ordered in UC Medications - No data to display  Initial Impression / Assessment and Plan / UC Course  I have reviewed the triage vital signs and the nursing notes.  Pertinent labs & imaging results that were available during my care of the patient were reviewed by me and considered in my medical decision making (see chart for details).   Patient is a pleasant, nontoxic-appearing 73 year old female presenting for evaluation of sore throat, headache, body aches, and intermittent nonproductive cough that started yesterday.  No fever or upper respiratory symptoms.  She does have very  mild erythema and injection to the posterior pharynx with clear postnasal drip but no tonsillar hypertrophy or exudate.  No cervical lymphadenopathy present on exam.  Cardiopulmonary exam is benign.  I do not suspect strep as patient has not had a fever and she does not have any edema of her tonsillar pillars.  I will order a COVID PCR to evaluate for the presence of COVID however.  COVID PCR is negative.  I will discharge patient home with a diagnosis of viral pharyngitis.  She can use over-the-counter Tylenol as needed for pain.  Salt water gargles as well as Chloraseptic and Sucrets lozenges.  Any new symptoms develop she can return for reevaluation or see her PCP.   Final Clinical Impressions(s) / UC Diagnoses   Final diagnoses:  Viral pharyngitis     Discharge Instructions      Your COVID test today was negative.  I do believe you have a viral infection which is causing your symptoms.  Gargle with warm salt water 2-3 times a day to soothe your throat, aid in pain relief, and aid in healing.  Take over-the-counter Tylenol and/or ibuprofen according to the package instructions as needed for pain.  You can also use Chloraseptic or Sucrets lozenges, 1 lozenge every 2 hours as needed for throat pain.  If you develop any new or worsening symptoms return for reevaluation.      ED Prescriptions   None    PDMP not reviewed this encounter.   Becky Augusta, NP 11/06/22 [phone]

## 2022-11-06 NOTE — ED Triage Notes (Signed)
Sx started yesterday. Headache-sore throat- body aches.

## 2022-11-16 ENCOUNTER — Other Ambulatory Visit: Payer: Self-pay | Admitting: Family Medicine

## 2022-11-16 DIAGNOSIS — Z1231 Encounter for screening mammogram for malignant neoplasm of breast: Secondary | ICD-10-CM

## 2022-12-01 ENCOUNTER — Ambulatory Visit
Admission: RE | Admit: 2022-12-01 | Discharge: 2022-12-01 | Disposition: A | Discharge status: Home / Self Care | Payer: Medicare Other | Source: Ambulatory Visit | Attending: Family Medicine | Admitting: Family Medicine

## 2022-12-01 DIAGNOSIS — Z1231 Encounter for screening mammogram for malignant neoplasm of breast: Secondary | ICD-10-CM | POA: Diagnosis present

## 2023-02-23 ENCOUNTER — Other Ambulatory Visit: Payer: Self-pay | Admitting: Orthopedic Surgery

## 2023-02-23 DIAGNOSIS — M4807 Spinal stenosis, lumbosacral region: Secondary | ICD-10-CM

## 2023-02-23 DIAGNOSIS — G8929 Other chronic pain: Secondary | ICD-10-CM

## 2023-04-03 ENCOUNTER — Other Ambulatory Visit: Payer: Self-pay | Admitting: Physician Assistant

## 2023-04-03 DIAGNOSIS — R2689 Other abnormalities of gait and mobility: Secondary | ICD-10-CM

## 2023-04-03 DIAGNOSIS — R531 Weakness: Secondary | ICD-10-CM

## 2023-04-03 DIAGNOSIS — M542 Cervicalgia: Secondary | ICD-10-CM

## 2023-04-21 ENCOUNTER — Ambulatory Visit: Payer: Medicare Other

## 2023-04-21 ENCOUNTER — Ambulatory Visit
Admission: RE | Admit: 2023-04-21 | Discharge: 2023-04-21 | Disposition: A | Discharge status: Home / Self Care | Payer: Medicare Other | Source: Ambulatory Visit | Attending: Physician Assistant | Admitting: Physician Assistant

## 2023-04-21 DIAGNOSIS — M542 Cervicalgia: Secondary | ICD-10-CM | POA: Diagnosis present

## 2023-04-21 DIAGNOSIS — R2689 Other abnormalities of gait and mobility: Secondary | ICD-10-CM | POA: Insufficient documentation

## 2023-04-21 DIAGNOSIS — R531 Weakness: Secondary | ICD-10-CM | POA: Diagnosis present

## 2023-04-26 ENCOUNTER — Other Ambulatory Visit: Payer: Self-pay | Admitting: Specialist

## 2023-04-26 DIAGNOSIS — Z87891 Personal history of nicotine dependence: Secondary | ICD-10-CM

## 2023-05-08 ENCOUNTER — Ambulatory Visit: Admission: RE | Admit: 2023-05-08 | Source: Ambulatory Visit

## 2023-08-16 ENCOUNTER — Other Ambulatory Visit: Payer: Self-pay

## 2023-08-16 ENCOUNTER — Emergency Department
Admission: EM | Admit: 2023-08-16 | Discharge: 2023-08-16 | Disposition: A | Discharge status: Home / Self Care | Attending: Emergency Medicine | Admitting: Emergency Medicine

## 2023-08-16 ENCOUNTER — Emergency Department

## 2023-08-16 ENCOUNTER — Encounter: Payer: Self-pay | Admitting: Emergency Medicine

## 2023-08-16 DIAGNOSIS — R0789 Other chest pain: Secondary | ICD-10-CM | POA: Insufficient documentation

## 2023-08-16 DIAGNOSIS — X58XXXA Exposure to other specified factors, initial encounter: Secondary | ICD-10-CM | POA: Insufficient documentation

## 2023-08-16 DIAGNOSIS — M542 Cervicalgia: Secondary | ICD-10-CM | POA: Diagnosis present

## 2023-08-16 DIAGNOSIS — S161XXA Strain of muscle, fascia and tendon at neck level, initial encounter: Secondary | ICD-10-CM | POA: Insufficient documentation

## 2023-08-16 DIAGNOSIS — I10 Essential (primary) hypertension: Secondary | ICD-10-CM | POA: Diagnosis not present

## 2023-08-16 LAB — CBC
HCT: 41.1 % (ref 36.0–46.0)
Hemoglobin: 12.6 g/dL (ref 12.0–15.0)
MCH: 29 pg (ref 26.0–34.0)
MCHC: 30.7 g/dL (ref 30.0–36.0)
MCV: 94.7 fL (ref 80.0–100.0)
Platelets: 287 10*3/uL (ref 150–400)
RBC: 4.34 MIL/uL (ref 3.87–5.11)
RDW: 16.1 % — ABNORMAL HIGH (ref 11.5–15.5)
WBC: 7 10*3/uL (ref 4.0–10.5)
nRBC: 0 % (ref 0.0–0.2)

## 2023-08-16 LAB — BASIC METABOLIC PANEL WITH GFR
Anion gap: 10 (ref 5–15)
BUN: 19 mg/dL (ref 8–23)
CO2: 29 mmol/L (ref 22–32)
Calcium: 9.4 mg/dL (ref 8.9–10.3)
Chloride: 99 mmol/L (ref 98–111)
Creatinine, Ser: 0.97 mg/dL (ref 0.44–1.00)
GFR, Estimated: >60 mL/min (ref >60)
Glucose, Bld: 106 mg/dL — ABNORMAL HIGH (ref 70–99)
Potassium: 4.4 mmol/L (ref 3.5–5.1)
Sodium: 138 mmol/L (ref 135–145)

## 2023-08-16 LAB — TROPONIN I (HIGH SENSITIVITY): Troponin I (High Sensitivity): 6 ng/L (ref <18)

## 2023-08-16 MED ORDER — CYCLOBENZAPRINE HCL 5 MG PO TABS: 5.0000 mg | ORAL_TABLET | Freq: Three times a day (TID) | ORAL | 0 refills | Status: AC | PRN

## 2023-08-16 MED ORDER — LIDOCAINE 5 % EX PTCH
1.0000 | MEDICATED_PATCH | Freq: Once | CUTANEOUS | Status: DC
  Administered 2023-08-16: 1 via TRANSDERMAL
  Filled 2023-08-16: qty 1

## 2023-08-16 MED ORDER — CYCLOBENZAPRINE HCL 10 MG PO TABS
5.0000 mg | ORAL_TABLET | Freq: Once | ORAL | Status: AC
  Administered 2023-08-16: 5 mg via ORAL
  Filled 2023-08-16: qty 1

## 2023-08-16 MED ORDER — KETOROLAC TROMETHAMINE 15 MG/ML IJ SOLN
15.0000 mg | Freq: Once | INTRAMUSCULAR | Status: AC
  Administered 2023-08-16: 15 mg via INTRAMUSCULAR
  Filled 2023-08-16: qty 1

## 2023-08-16 NOTE — ED Triage Notes (Signed)
 Patient to ED via POV for centralized CP that radiates into left arm. PT reports that it started on Saturday. Denies cardiac hx.

## 2023-08-16 NOTE — ED Provider Notes (Signed)
 [facility] Provider Note    Event Date/Time   First MD Initiated Contact with Patient 08/16/23 1413     (approximate)   History   Chest Pain   HPI  Heather Phelps is a 73 year old female with history of OSA, hypertension, anginal symptoms presenting to the emergency department for evaluation of neck and chest wall pain.  Patient reports she woke up on Saturday and noticed discomfort in her neck with pain when she tries to turn to her left side.  She does note that when she turns towards her left side she does feel that it pulls towards her chest wall and has some pain over her chest wall.  Denies shortness of breath, nausea, diaphoresis.   I reviewed her cardiology visit from 03/27/2023.  At that time, patient was having occasional anginal chest pain with recommended medical therapy with consideration for further ischemia workup if persistent symptoms.      Physical Exam   Triage Vital Signs: ED Triage Vitals  Encounter Vitals Group     BP 08/16/23 1322 111/63     Girls Systolic BP Percentile --      Girls Diastolic BP Percentile --      Boys Systolic BP Percentile --      Boys Diastolic BP Percentile --      Pulse Rate 08/16/23 1322 (!) 57     Resp 08/16/23 1322 17     Temp 08/16/23 1322 98.4 F (36.9 C)     Temp Source 08/16/23 1322 Oral     SpO2 08/16/23 1322 92 %     Weight 08/16/23 1324 145 lb (65.8 kg)     Height 08/16/23 1324 5' 3 (1.6 m)     Head Circumference --      Peak Flow --      Pain Score 08/16/23 1323 10     Pain Loc --      Pain Education --      Exclude from Growth Chart --     Most recent vital signs: Vitals:   08/16/23 1322  BP: 111/63  Pulse: (!) 57  Resp: 17  Temp: 98.4 F (36.9 C)  SpO2: 92%     General: Awake, interactive  CV:  Regular rate, good peripheral perfusion.  Resp:  Unlabored respirations.  Abd:  Nondistended.  Neuro:  Symmetric facial movement, fluid speech MSK:  Readily reproducible  tenderness to palpation primarily along the lateral neck into the paraspinous musculature.  Tenderness does extend anteriorly over the chest wall, nonspecifically over the costochondral junction   ED Results / Procedures / Treatments   Labs (all labs ordered are listed, but only abnormal results are displayed) Labs Reviewed  BASIC METABOLIC PANEL WITH GFR - Abnormal; Notable for the following components:      Result Value   Glucose, Bld 106 (*)    All other components within normal limits  CBC - Abnormal; Notable for the following components:   RDW 16.1 (*)    All other components within normal limits  TROPONIN I (HIGH SENSITIVITY)     EKG EKG independently reviewed and interpreted by myself demonstrates:  EKG demonstrates sinus bradycardia at a rate of 58, PR 140, QRS 104, QTc 365, nonspecific ST changes, no STEMI  RADIOLOGY Imaging independently reviewed and interpreted by myself demonstrates:  CXR without focal consolidation  Formal Radiology Read:  DG Chest 2 View Result Date: 08/16/2023 CLINICAL DATA:  Centralized chest pain radiating to the left jaw,  neck and arm the past 4 days. EXAM: CHEST - 2 VIEW COMPARISON:  03/21/2013.  Chest CT dated 06/08/2022. FINDINGS: Normal-sized heart. Stable linear scarring in the right middle lobe. Otherwise, clear lungs with normal vascularity. Mild thoracic spine degenerative changes. IMPRESSION: No active cardiopulmonary disease. Electronically Signed   By: Elspeth Bathe M.D.   On: 08/16/2023 14:23    PROCEDURES:  Critical Care performed: No  Procedures   MEDICATIONS ORDERED IN ED: Medications  lidocaine  (LIDODERM ) 5 % 1 patch (1 patch Transdermal Patch Applied 08/16/23 1452)  cyclobenzaprine (FLEXERIL) tablet 5 mg (5 mg Oral Given 08/16/23 1452)  ketorolac (TORADOL) 15 MG/ML injection 15 mg (15 mg Intramuscular Given 08/16/23 1452)     IMPRESSION / MDM / ASSESSMENT AND PLAN / ED COURSE  I reviewed the triage vital signs and the nursing  notes.  Differential diagnosis includes, but is not limited to, musculoskeletal strain, torticollis, much lower suspicion ACS, pneumonia, pneumothorax  Patient's presentation is most consistent with acute presentation with potential threat to life or bodily function.  73 year old female presenting with neck and chest pain.  Readily reproducible tenderness over her muscles on exam.  Stable vitals on presentation.  Labs reassuring including CBC, BMP, troponin with well over 3 hours of symptoms.  Does have cardiac risk factors, but I have very low suspicion for ischemic pain today.  Patient was treated symptomatically for suspected muscle strain.  She was reassessed denies new complaints.  She is comfortable with discharge with strict return precautions.  Patient discharged stable condition.      FINAL CLINICAL IMPRESSION(S) / ED DIAGNOSES   Final diagnoses:  Strain of neck muscle, initial encounter  Chest wall pain     Rx / DC Orders   ED Discharge Orders          Ordered    cyclobenzaprine (FLEXERIL) 5 MG tablet  3 times daily PRN        08/16/23 1551             Note:  This document was prepared using Dragon voice recognition software and may include unintentional dictation errors.   Levander Slate, MD 08/16/23 [phone]

## 2023-08-16 NOTE — Telephone Encounter (Signed)
 Provider FYI ONLY Take Action Suggestions: Disposition:911/EMS Patient Agreed to Disposition       Reason for call: Patient and nephew  and Patient is calling for:  Chief Complaint  Patient presents with  . Chest Pain     Pt with left chest and left arm pain now , pain is worsening Denies Sob, speech is clear , pt ambulatory Spoke with kevondae (pt's nephew)   Actions taken during call:   Called EMS with pt and nephew on the line Stayed on phone with family until EMS arrived  Patient advised to call 911 if having an emergency or have someone assist with transportation to the nearest Emergency Room.  Routed encounter to Provider Triage: Triage completed, care advice given per protocol. Triage:Call back parameters given and caller advised of 24 hour nurse triage. Instructed to seek immediate medical attention if new symptoms develop, current symptoms worsen or if you become increasingly concerned. Patient verbalized understanding.   EMS recommended that she chew 4 Bayer ASA now   REVONDA COMER, RN [facility]  Patient Engagement Center Documentation    Reason for Disposition . Chest pain lasting longer than 5 minutes and ANY of the following:* Over 69 years old* Over 63 years old and at least one cardiac risk factor (i.e., high blood pressure, diabetes, high cholesterol, obesity, smoker or strong family history of heart disease)* Pain is crushing, pressure-like, or heavy * Took nitroglycerin and chest pain was not relieved* History of heart disease (i.e., angina, heart attack, bypass surgery, angioplasty, CHF)  Additional Information . Negative: SEVERE difficulty breathing (e.g., struggling for each breath, speaks in single words) . Negative: Passed out (i.e., fainted, collapsed and was not responding)  Protocols used: Chest Pain-A-[place]

## 2023-08-16 NOTE — Discharge Instructions (Addendum)
 You can take Tylenol  to help with your pain.  Have also sent a prescription for muscle relaxer to your pharmacy to help with muscle stiffness.  This can make you drowsy, do not drive or operate machinery when taking this.  Follow with your primary care doctor for further evaluation.  Return to the ER for new or worsening symptoms.

## 2023-08-16 NOTE — ED Notes (Signed)
 First Nurse Note: Pt to ED via [facility] from home. Pt here for left shoulder pain since Monday. Per EMS they were called out for chest pain. When they got there pt was c/o left shoulder pain with movement. Pt seen previously at [facility] for shoulder bursitis. EKG WNL with EMS BP 116/62. Pt is in NAD.

## 2023-11-01 ENCOUNTER — Other Ambulatory Visit: Payer: Self-pay | Admitting: *Deleted

## 2023-11-01 DIAGNOSIS — Z87891 Personal history of nicotine dependence: Secondary | ICD-10-CM

## 2023-11-01 DIAGNOSIS — Z122 Encounter for screening for malignant neoplasm of respiratory organs: Secondary | ICD-10-CM

## 2023-11-13 ENCOUNTER — Ambulatory Visit
Admission: RE | Admit: 2023-11-13 | Discharge: 2023-11-13 | Disposition: A | Discharge status: Home / Self Care | Source: Ambulatory Visit | Attending: Acute Care | Admitting: Acute Care

## 2023-11-13 DIAGNOSIS — Z87891 Personal history of nicotine dependence: Secondary | ICD-10-CM | POA: Diagnosis present

## 2023-11-13 DIAGNOSIS — Z122 Encounter for screening for malignant neoplasm of respiratory organs: Secondary | ICD-10-CM | POA: Diagnosis present

## 2023-11-17 ENCOUNTER — Other Ambulatory Visit: Payer: Self-pay

## 2023-11-17 DIAGNOSIS — Z122 Encounter for screening for malignant neoplasm of respiratory organs: Secondary | ICD-10-CM

## 2023-11-17 DIAGNOSIS — Z87891 Personal history of nicotine dependence: Secondary | ICD-10-CM

## 2023-11-20 ENCOUNTER — Other Ambulatory Visit: Payer: Self-pay | Admitting: Family Medicine

## 2023-11-20 DIAGNOSIS — Z1231 Encounter for screening mammogram for malignant neoplasm of breast: Secondary | ICD-10-CM

## 2023-12-20 ENCOUNTER — Encounter

## 2024-01-24 ENCOUNTER — Ambulatory Visit
Admission: RE | Admit: 2024-01-24 | Discharge: 2024-01-24 | Disposition: A | Discharge status: Home / Self Care | Source: Ambulatory Visit | Attending: Family Medicine | Admitting: Family Medicine

## 2024-01-24 DIAGNOSIS — Z1231 Encounter for screening mammogram for malignant neoplasm of breast: Secondary | ICD-10-CM | POA: Diagnosis present

## 2024-02-20 ENCOUNTER — Encounter: Payer: Self-pay | Admitting: Orthopedic Surgery
